# Patient Record
Sex: Female | Born: 1995 | Race: White | Hispanic: No | Marital: Married | Smoking: Former smoker
Health system: Southern US, Community
[De-identification: ages and names within clinical notes are randomized; demographics above are authoritative.]

## PROBLEM LIST (undated history)

## (undated) DIAGNOSIS — F32A Depression, unspecified: Secondary | ICD-10-CM

## (undated) DIAGNOSIS — F4310 Post-traumatic stress disorder, unspecified: Secondary | ICD-10-CM

## (undated) DIAGNOSIS — F419 Anxiety disorder, unspecified: Secondary | ICD-10-CM

## (undated) DIAGNOSIS — Z8041 Family history of malignant neoplasm of ovary: Secondary | ICD-10-CM

## (undated) DIAGNOSIS — O24419 Gestational diabetes mellitus in pregnancy, unspecified control: Secondary | ICD-10-CM

## (undated) DIAGNOSIS — F329 Major depressive disorder, single episode, unspecified: Secondary | ICD-10-CM

## (undated) HISTORY — DX: Family history of malignant neoplasm of ovary: Z80.41

## (undated) HISTORY — DX: Anxiety disorder, unspecified: F41.9

## (undated) HISTORY — PX: WISDOM TOOTH EXTRACTION: SHX21

## (undated) HISTORY — DX: Depression, unspecified: F32.A

## (undated) HISTORY — DX: Gestational diabetes mellitus in pregnancy, unspecified control: O24.419

## (undated) HISTORY — DX: Major depressive disorder, single episode, unspecified: F32.9

## (undated) HISTORY — DX: Post-traumatic stress disorder, unspecified: F43.10

---

## 2017-09-21 ENCOUNTER — Ambulatory Visit: Payer: Medicaid Other | Admitting: Family Medicine

## 2017-10-03 ENCOUNTER — Encounter: Payer: Self-pay | Admitting: Family Medicine

## 2017-10-03 ENCOUNTER — Ambulatory Visit (INDEPENDENT_AMBULATORY_CARE_PROVIDER_SITE_OTHER): Payer: Medicaid Other | Admitting: Family Medicine

## 2017-10-03 VITALS — BP 90/58 | HR 80 | Ht 61.0 in | Wt 115.0 lb

## 2017-10-03 DIAGNOSIS — Z7689 Persons encountering health services in other specified circumstances: Secondary | ICD-10-CM | POA: Diagnosis not present

## 2017-10-03 NOTE — Patient Instructions (Signed)
Acne  Acne is a skin problem that causes small, red bumps (pimples). Acne happens when the tiny holes in your skin (pores) get blocked. Your pores may become red, sore, and swollen. They may also become infected. Acne is a common skin problem. It is especially common in teenagers. Acne usually goes away over time.  Follow these instructions at home:  Good skin care is the most important thing you can do to treat your acne. Take care of your skin as told by your doctor. You may be told to do these things:  · Wash your skin gently at least two times each day. You should also wash your skin:  ? After you exercise.  ? Before you go to bed.  · Use mild soap.  · Use a water-based skin moisturizer after you wash your skin.  · Use a sunscreen or sunblock with SPF 30 or greater. This is very important if you are using acne medicines.  · Choose cosmetics that will not plug your oil glands (are noncomedogenic).    Medicines  · Take over-the-counter and prescription medicines only as told by your doctor.  · If you were prescribed an antibiotic medicine, apply or take it as told by your doctor. Do not stop using the antibiotic even if your acne improves.  General instructions  · Keep your hair clean and off of your face. Shampoo your hair regularly. If you have oily hair, you may need to wash it every day.  · Avoid leaning your chin or forehead on your hands.  · Avoid wearing tight headbands or hats.  · Avoid picking or squeezing your pimples. That can make your acne worse and cause scarring.  · Keep all follow-up visits as told by your doctor. This is important.  · Shave gently. Only shave when it is necessary.  · Keep a food journal. This can help you to see if any foods are linked with your acne.  Contact a doctor if:  · Your acne is not better after eight weeks.  · Your acne gets worse.  · You have a large area of skin that is red or tender.  · You think that you are having side effects from any acne medicine.  This  information is not intended to replace advice given to you by your health care provider. Make sure you discuss any questions you have with your health care provider.  Document Released: 02/03/2011 Document Revised: 07/23/2015 Document Reviewed: 04/23/2014  Elsevier Interactive Patient Education © 2018 [employer]

## 2017-10-03 NOTE — Progress Notes (Signed)
Name: Bethany Williams   MRN: [id number]    DOB: 1995/06/29   Date:10/03/2017       Progress Note  Subjective  Chief Complaint  Chief Complaint  Patient presents with  . Establish Care    Patient to establish primary care. Patient on Hydoxyzine 50 mg and celexa 40 mg.     No problem-specific Assessment & Plan notes found for this encounter.   Past Medical History:  Diagnosis Date  . Anxiety   . Depression     History reviewed. No pertinent surgical history.  Family History  Problem Relation Age of Onset  . Heart disease Mother   . Stroke Mother   . Hypertension Mother   . Diabetes Sister   . Hypertension Sister   . Cancer Maternal Grandmother   . Hypertension Maternal Grandmother   . Diabetes Maternal Grandfather   . Hypertension Maternal Grandfather   . Cancer Paternal Grandfather     Social History   Socioeconomic History  . Marital status: Single    Spouse name: Not on file  . Number of children: Not on file  . Years of education: Not on file  . Highest education level: Not on file  Occupational History  . Not on file  Social Needs  . Financial resource strain: Not on file  . Food insecurity:    Worry: Not on file    Inability: Not on file  . Transportation needs:    Medical: Not on file    Non-medical: Not on file  Tobacco Use  . Smoking status: Former Smoker    Types: Cigarettes    Last attempt to quit: 01/28/2017    Years since quitting: 0.6  . Smokeless tobacco: Never Used  Substance and Sexual Activity  . Alcohol use: Yes  . Drug use: Never  . Sexual activity: Yes  Lifestyle  . Physical activity:    Days per week: Not on file    Minutes per session: Not on file  . Stress: Not on file  Relationships  . Social connections:    Talks on phone: Not on file    Gets together: Not on file    Attends religious service: Not on file    Active member of club or organization: Not on file    Attends meetings of clubs or organizations: Not on file     Relationship status: Not on file  . Intimate partner violence:    Fear of current or ex partner: Not on file    Emotionally abused: Not on file    Physically abused: Not on file    Forced sexual activity: Not on file  Other Topics Concern  . Not on file  Social History Narrative  . Not on file    Allergies  Allergen Reactions  . Dilaudid [Hydromorphone]   . Latex   . Sulfa Antibiotics     Outpatient Medications Prior to Visit  Medication Sig Dispense Refill  . citalopram (CELEXA) 40 MG tablet Take 40 mg by mouth daily.    . hydrOXYzine (ATARAX/VISTARIL) 50 MG tablet Take 50 mg by mouth daily.     No facility-administered medications prior to visit.     Review of Systems  Constitutional: Negative for chills, fever, malaise/fatigue and weight loss.  HENT: Negative for ear discharge, ear pain and sore throat.   Eyes: Negative for blurred vision.  Respiratory: Negative for cough, sputum production, shortness of breath and wheezing.   Cardiovascular: Negative for chest pain, palpitations and leg  swelling.  Gastrointestinal: Negative for abdominal pain, blood in stool, constipation, diarrhea, heartburn, melena and nausea.  Genitourinary: Negative for dysuria, frequency, hematuria and urgency.  Musculoskeletal: Negative for back pain, joint pain, myalgias and neck pain.  Skin: Negative for rash.       Acneform rash  Neurological: Negative for dizziness, tingling, sensory change, focal weakness and headaches.  Endo/Heme/Allergies: Negative for environmental allergies and polydipsia. Does not bruise/bleed easily.  Psychiatric/Behavioral: Negative for depression and suicidal ideas. The patient is not nervous/anxious and does not have insomnia.      Objective  Vitals:   10/03/17 1449  BP: (!) 90/58  Pulse: 80  Weight: 115 lb (52.2 kg)  Height: 5\' 1"  (1.549 m)    Physical Exam  Constitutional: She is oriented to person, place, and time. She appears well-developed and  well-nourished.  HENT:  Head: Normocephalic.  Right Ear: External ear normal.  Left Ear: External ear normal.  Mouth/Throat: Oropharynx is clear and moist.  Eyes: Pupils are equal, round, and reactive to light. Conjunctivae and EOM are normal. Lids are everted and swept, no foreign bodies found. Left eye exhibits no hordeolum. No foreign body present in the left eye. Right conjunctiva is not injected. Left conjunctiva is not injected. No scleral icterus.  Neck: Normal range of motion. Neck supple. No JVD present. No tracheal deviation present. No thyromegaly present.  Cardiovascular: Normal rate, regular rhythm, normal heart sounds and intact distal pulses. Exam reveals no gallop and no friction rub.  No murmur heard. Pulmonary/Chest: Effort normal and breath sounds normal. No respiratory distress. She has no wheezes. She has no rales.  Abdominal: Soft. Bowel sounds are normal. She exhibits no mass. There is no hepatosplenomegaly. There is no tenderness. There is no rebound and no guarding.  Musculoskeletal: Normal range of motion. She exhibits no edema or tenderness.  Lymphadenopathy:    She has no cervical adenopathy.  Neurological: She is alert and oriented to person, place, and time. She has normal strength. She displays normal reflexes. No cranial nerve deficit.  Skin: Skin is warm. No rash noted.  Psychiatric: She has a normal mood and affect. Her mood appears not anxious. She does not exhibit a depressed mood.  Nursing note and vitals reviewed.     Assessment & Plan  Problem List Items Addressed This Visit    None    Visit Diagnoses    Establishing care with new doctor, encounter for    -  Primary   past history reviewed/ did not need refills on depression meds- stable on meds      No orders of the defined types were placed in this encounter.     Dr. Hayden Rasmusseneanna Quantez [facility]  10/03/17

## 2017-10-18 ENCOUNTER — Other Ambulatory Visit: Payer: Self-pay

## 2017-10-18 ENCOUNTER — Encounter: Payer: Self-pay | Admitting: Emergency Medicine

## 2017-10-18 ENCOUNTER — Emergency Department
Admission: EM | Admit: 2017-10-18 | Discharge: 2017-10-18 | Disposition: A | Discharge status: Home / Self Care | Payer: Medicaid Other | Attending: Emergency Medicine | Admitting: Emergency Medicine

## 2017-10-18 DIAGNOSIS — Z79899 Other long term (current) drug therapy: Secondary | ICD-10-CM | POA: Insufficient documentation

## 2017-10-18 DIAGNOSIS — Z87891 Personal history of nicotine dependence: Secondary | ICD-10-CM | POA: Diagnosis not present

## 2017-10-18 DIAGNOSIS — Z91040 Latex allergy status: Secondary | ICD-10-CM | POA: Diagnosis not present

## 2017-10-18 DIAGNOSIS — G43909 Migraine, unspecified, not intractable, without status migrainosus: Secondary | ICD-10-CM

## 2017-10-18 DIAGNOSIS — R112 Nausea with vomiting, unspecified: Secondary | ICD-10-CM | POA: Diagnosis not present

## 2017-10-18 LAB — URINALYSIS, COMPLETE (UACMP) WITH MICROSCOPIC
Bacteria, UA: NONE SEEN
Bilirubin Urine: NEGATIVE
GLUCOSE, UA: NEGATIVE mg/dL
Ketones, ur: 20 mg/dL — AB
NITRITE: NEGATIVE
PH: 6 (ref 5.0–8.0)
Protein, ur: NEGATIVE mg/dL
SPECIFIC GRAVITY, URINE: 1.021 (ref 1.005–1.030)

## 2017-10-18 LAB — COMPREHENSIVE METABOLIC PANEL
ALBUMIN: 4.6 g/dL (ref 3.5–5.0)
ALK PHOS: 52 U/L (ref 38–126)
ALT: 8 U/L (ref 0–44)
ANION GAP: 6 (ref 5–15)
AST: 16 U/L (ref 15–41)
BILIRUBIN TOTAL: 0.3 mg/dL (ref 0.3–1.2)
BUN: 20 mg/dL (ref 6–20)
CALCIUM: 9.2 mg/dL (ref 8.9–10.3)
CO2: 27 mmol/L (ref 22–32)
CREATININE: 0.82 mg/dL (ref 0.44–1.00)
Chloride: 107 mmol/L (ref 98–111)
GFR calc Af Amer: >60 mL/min (ref >60)
GFR calc non Af Amer: >60 mL/min (ref >60)
GLUCOSE: 87 mg/dL (ref 70–99)
Potassium: 3.8 mmol/L (ref 3.5–5.1)
Sodium: 140 mmol/L (ref 135–145)
TOTAL PROTEIN: 7.6 g/dL (ref 6.5–8.1)

## 2017-10-18 LAB — CBC
HCT: 35.9 % (ref 35.0–47.0)
Hemoglobin: 12.6 g/dL (ref 12.0–16.0)
MCH: 31.2 pg (ref 26.0–34.0)
MCHC: 35.1 g/dL (ref 32.0–36.0)
MCV: 88.7 fL (ref 80.0–100.0)
PLATELETS: 285 10*3/uL (ref 150–440)
RBC: 4.05 MIL/uL (ref 3.80–5.20)
RDW: 12.6 % (ref 11.5–14.5)
WBC: 8.7 10*3/uL (ref 3.6–11.0)

## 2017-10-18 LAB — LIPASE, BLOOD: Lipase: 28 U/L (ref 11–51)

## 2017-10-18 LAB — POCT PREGNANCY, URINE: Preg Test, Ur: NEGATIVE

## 2017-10-18 MED ORDER — BUTALBITAL-APAP-CAFFEINE 50-325-40 MG PO TABS: 1.0000 | ORAL_TABLET | Freq: Four times a day (QID) | ORAL | 0 refills | Status: DC | PRN

## 2017-10-18 MED ORDER — KETOROLAC TROMETHAMINE 30 MG/ML IJ SOLN
30.0000 mg | Freq: Once | INTRAMUSCULAR | Status: AC
  Administered 2017-10-18: 30 mg via INTRAVENOUS
  Filled 2017-10-18: qty 1

## 2017-10-18 MED ORDER — DIPHENHYDRAMINE HCL 50 MG/ML IJ SOLN
25.0000 mg | Freq: Once | INTRAMUSCULAR | Status: AC
  Administered 2017-10-18: 25 mg via INTRAVENOUS
  Filled 2017-10-18: qty 1

## 2017-10-18 MED ORDER — METOCLOPRAMIDE HCL 5 MG/ML IJ SOLN
10.0000 mg | Freq: Once | INTRAMUSCULAR | Status: AC
  Administered 2017-10-18: 10 mg via INTRAVENOUS
  Filled 2017-10-18: qty 2

## 2017-10-18 MED ORDER — SODIUM CHLORIDE 0.9 % IV SOLN
Freq: Once | INTRAVENOUS | Status: AC
  Administered 2017-10-18: 999 mL/h via INTRAVENOUS

## 2017-10-18 NOTE — Discharge Instructions (Addendum)
Please follow up with your primary care physician.

## 2017-10-18 NOTE — ED Notes (Signed)
ED Provider at bedside. 

## 2017-10-18 NOTE — ED Triage Notes (Signed)
Patient ambulatory to triage with steady gait, without difficulty or distress noted; pt reports migraine behind eyes accomp by nausea last few days unrelieved by tylenol

## 2017-10-18 NOTE — ED Provider Notes (Signed)
[facility] Emergency Department Provider Note   ____________________________________________   First MD Initiated Contact with Patient 10/18/17 0107     (approximate)  I have reviewed the triage vital signs and the nursing notes.   HISTORY  Chief Complaint Headache    HPI Bethany Williams is a 22 y.o. female who comes into the hospital today with a migraine.  The patient states that she had a for the last couple of days.  She reports that now she cannot keep anything down and she has not urinated in multiple hours.  The patient states that she does have a history of migraines and normally she would just take ibuprofen Tylenol Aleve but nothing has been working.  She rates her pain a 7 8 out of 10 in intensity.  This does feel like a typical migraine is just lasted longer.  She is sensitive to light but has no blurred vision.  She is here today for evaluation.  Past Medical History:  Diagnosis Date  . Anxiety   . Depression     There are no active problems to display for this patient.   History reviewed. No pertinent surgical history.  Prior to Admission medications   Medication Sig Start Date End Date Taking? Authorizing Provider  butalbital-acetaminophen-caffeine (FIORICET, ESGIC) [id number] MG tablet Take 1-2 tablets by mouth every 6 (six) hours as needed for headache. 10/18/17 10/18/18  Rebecka ApleyWebster, Enyah Moman P, MD  citalopram (CELEXA) 40 MG tablet Take 40 mg by mouth daily.    [provider]  hydrOXYzine (ATARAX/VISTARIL) 50 MG tablet Take 50 mg by mouth daily.    [provider]    Allergies Dilaudid [hydromorphone]; Latex; and Sulfa antibiotics  Family History  Problem Relation Age of Onset  . Heart disease Mother   . Stroke Mother   . Hypertension Mother   . Diabetes Sister   . Hypertension Sister   . Cancer Maternal Grandmother   . Hypertension Maternal Grandmother   . Diabetes Maternal Grandfather   . Hypertension Maternal  Grandfather   . Cancer Paternal Grandfather     Social History Social History   Tobacco Use  . Smoking status: Former Smoker    Types: Cigarettes    Last attempt to quit: 01/28/2017    Years since quitting: 0.7  . Smokeless tobacco: Never Used  Substance Use Topics  . Alcohol use: Yes  . Drug use: Never    Review of Systems  Constitutional: No fever/chills Eyes: No visual changes. ENT: No sore throat. Cardiovascular: Denies chest pain. Respiratory: Denies shortness of breath. Gastrointestinal: Nausea and vomiting with no abdominal pain.  No diarrhea.  No constipation. Genitourinary: Negative for dysuria. Musculoskeletal: Negative for back pain. Skin: Negative for rash. Neurological: Headache   ____________________________________________   PHYSICAL EXAM:  VITAL SIGNS: ED Triage Vitals  Enc Vitals Group     BP 10/18/17 0026 107/63     Pulse Rate 10/18/17 0026 70     Resp 10/18/17 0026 18     Temp 10/18/17 0026 98 F (36.7 C)     Temp Source 10/18/17 0026 Oral     SpO2 10/18/17 0026 98 %     Weight 10/18/17 0025 115 lb (52.2 kg)     Height 10/18/17 0025 5' (1.524 m)     Head Circumference --      Peak Flow --      Pain Score 10/18/17 0024 9     Pain Loc --  Pain Edu? --      Excl. in GC? --     Constitutional: Alert and oriented. Well appearing and in moderate distress. Eyes: Conjunctivae are normal. PERRL. EOMI. Head: Atraumatic. Nose: No congestion/rhinnorhea. Mouth/Throat: Mucous membranes are moist.  Oropharynx non-erythematous. Cardiovascular: Normal rate, regular rhythm. Grossly normal heart sounds.  Good peripheral circulation. Respiratory: Normal respiratory effort.  No retractions. Lungs CTAB. Gastrointestinal: Soft and nontender. No distention.  Positive bowel sounds Musculoskeletal: No lower extremity tenderness nor edema.   Neurologic:  Normal speech and language.  Skin:  Skin is warm, dry and intact.  Psychiatric: Mood and affect are  normal.   ____________________________________________   LABS (all labs ordered are listed, but only abnormal results are displayed)  Labs Reviewed  URINALYSIS, COMPLETE (UACMP) WITH MICROSCOPIC - Abnormal; Notable for the following components:      Result Value   Color, Urine YELLOW (*)    APPearance HAZY (*)    Hgb urine dipstick SMALL (*)    Ketones, ur 20 (*)    Leukocytes, UA MODERATE (*)    All other components within normal limits  LIPASE, BLOOD  COMPREHENSIVE METABOLIC PANEL  CBC  POC URINE PREG, ED  POCT PREGNANCY, URINE   ____________________________________________  EKG  none ____________________________________________  RADIOLOGY  ED MD interpretation:  none  Official radiology report(s): No results found.  ____________________________________________   PROCEDURES  Procedure(s) performed: None  Procedures  Critical Care performed: No  ____________________________________________   INITIAL IMPRESSION / ASSESSMENT AND PLAN / ED COURSE  As part of my medical decision making, I reviewed the following data within the electronic MEDICAL RECORD NUMBER Notes from prior ED visits and [place] Controlled Substance Database   This is a 22 year old female who comes into the hospital today with a migraine headache.  Patient has a history of migraines.  I did give her a dose of Reglan, Benadryl and Toradol.  I also give the patient a liter of normal saline.  After receiving the medication the patient states that her headache is improved.  She will be discharged home to follow-up with her primary care physician.      ____________________________________________   FINAL CLINICAL IMPRESSION(S) / ED DIAGNOSES  Final diagnoses:  Migraine without status migrainosus, not intractable, unspecified migraine type     ED Discharge Orders         Ordered    butalbital-acetaminophen-caffeine (FIORICET, ESGIC) 50-325-40 MG tablet  Every 6 hours PRN     10/18/17 0224             Note:  This document was prepared using Dragon voice recognition software and may include unintentional dictation errors.    Rebecka ApleyWebster, Jazline Cumbee P, MD 10/18/17 [phone]

## 2017-10-31 ENCOUNTER — Ambulatory Visit
Admission: RE | Admit: 2017-10-31 | Discharge: 2017-10-31 | Disposition: A | Discharge status: Home / Self Care | Payer: Medicaid Other | Source: Ambulatory Visit | Attending: Family Medicine | Admitting: Family Medicine

## 2017-10-31 ENCOUNTER — Encounter: Payer: Self-pay | Admitting: Family Medicine

## 2017-10-31 ENCOUNTER — Encounter (INDEPENDENT_AMBULATORY_CARE_PROVIDER_SITE_OTHER): Payer: Self-pay

## 2017-10-31 ENCOUNTER — Ambulatory Visit (INDEPENDENT_AMBULATORY_CARE_PROVIDER_SITE_OTHER): Payer: Medicaid Other | Admitting: Family Medicine

## 2017-10-31 VITALS — BP 120/64 | HR 84 | Ht 60.0 in | Wt 112.0 lb

## 2017-10-31 DIAGNOSIS — S5702XA Crushing injury of left elbow, initial encounter: Secondary | ICD-10-CM

## 2017-10-31 DIAGNOSIS — N3090 Cystitis, unspecified without hematuria: Secondary | ICD-10-CM | POA: Diagnosis not present

## 2017-10-31 DIAGNOSIS — S5782XA Crushing injury of left forearm, initial encounter: Secondary | ICD-10-CM | POA: Diagnosis not present

## 2017-10-31 DIAGNOSIS — M25522 Pain in left elbow: Secondary | ICD-10-CM | POA: Diagnosis not present

## 2017-10-31 LAB — POCT URINALYSIS DIPSTICK
Bilirubin, UA: NEGATIVE
Blood, UA: NEGATIVE
GLUCOSE UA: NEGATIVE
Ketones, UA: NEGATIVE
NITRITE UA: NEGATIVE
PH UA: 7 (ref 5.0–8.0)
Protein, UA: POSITIVE — AB
Spec Grav, UA: 1.02 (ref 1.010–1.025)
Urobilinogen, UA: 0.2 E.U./dL

## 2017-10-31 MED ORDER — NITROFURANTOIN MONOHYD MACRO 100 MG PO CAPS: 100.0000 mg | ORAL_CAPSULE | Freq: Two times a day (BID) | ORAL | 0 refills | Status: DC

## 2017-10-31 NOTE — Progress Notes (Signed)
Name: Bethany Williams   MRN: [id number]    DOB: [birth year]   Date:10/31/2017       Progress Note  Subjective  Chief Complaint  Chief Complaint  Patient presents with  . Urinary Tract Infection  . Elbow Pain    "slammed elbow into door"    Urinary Tract Infection   This is a new problem. The current episode started in the past 7 days. The problem has been gradually worsening. The quality of the pain is described as burning. The pain is mild. Pertinent negatives include no chills, discharge, flank pain, frequency, hematuria, hesitancy, nausea, sweats, urgency or vomiting. The treatment provided mild relief.  Arm Pain   The incident occurred 2 days ago. The incident occurred at home. The injury mechanism was a direct blow (smashed in sliding glass door). The pain is present in the left elbow. The quality of the pain is described as aching. The pain does not radiate. The pain is at a severity of 6/10. The pain is moderate. The pain has been constant since the incident. Pertinent negatives include no chest pain or tingling.    No problem-specific Assessment & Plan notes found for this encounter.   Past Medical History:  Diagnosis Date  . Anxiety   . Depression     History reviewed. No pertinent surgical history.  Family History  Problem Relation Age of Onset  . Heart disease Mother   . Stroke Mother   . Hypertension Mother   . Diabetes Sister   . Hypertension Sister   . Cancer Maternal Grandmother   . Hypertension Maternal Grandmother   . Diabetes Maternal Grandfather   . Hypertension Maternal Grandfather   . Cancer Paternal Grandfather     Social History   Socioeconomic History  . Marital status: Single    Spouse name: Not on file  . Number of children: Not on file  . Years of education: Not on file  . Highest education level: Not on file  Occupational History  . Not on file  Social Needs  . Financial resource strain: Not on file  . Food insecurity:    Worry: Not on  file    Inability: Not on file  . Transportation needs:    Medical: Not on file    Non-medical: Not on file  Tobacco Use  . Smoking status: Former Smoker    Types: Cigarettes    Last attempt to quit: 01/28/2017    Years since quitting: 0.7  . Smokeless tobacco: Never Used  Substance and Sexual Activity  . Alcohol use: Yes  . Drug use: Never  . Sexual activity: Yes  Lifestyle  . Physical activity:    Days per week: Not on file    Minutes per session: Not on file  . Stress: Not on file  Relationships  . Social connections:    Talks on phone: Not on file    Gets together: Not on file    Attends religious service: Not on file    Active member of club or organization: Not on file    Attends meetings of clubs or organizations: Not on file    Relationship status: Not on file  . Intimate partner violence:    Fear of current or ex partner: Not on file    Emotionally abused: Not on file    Physically abused: Not on file    Forced sexual activity: Not on file  Other Topics Concern  . Not on file  Social History Narrative  .  Not on file    Allergies  Allergen Reactions  . Dilaudid [Hydromorphone]   . Latex   . Sulfa Antibiotics     Outpatient Medications Prior to Visit  Medication Sig Dispense Refill  . butalbital-acetaminophen-caffeine (FIORICET, ESGIC) 50-325-40 MG tablet Take 1-2 tablets by mouth every 6 (six) hours as needed for headache. 20 tablet 0  . citalopram (CELEXA) 40 MG tablet Take 40 mg by mouth daily.    . hydrOXYzine (ATARAX/VISTARIL) 50 MG tablet Take 50 mg by mouth daily.     No facility-administered medications prior to visit.     Review of Systems  Constitutional: Negative for chills, fever, malaise/fatigue and weight loss.  HENT: Negative for ear discharge, ear pain and sore throat.   Eyes: Negative for blurred vision.  Respiratory: Negative for cough, sputum production, shortness of breath and wheezing.   Cardiovascular: Negative for chest pain,  palpitations and leg swelling.  Gastrointestinal: Negative for abdominal pain, blood in stool, constipation, diarrhea, heartburn, melena, nausea and vomiting.  Genitourinary: Negative for dysuria, flank pain, frequency, hematuria, hesitancy and urgency.  Musculoskeletal: Negative for back pain, joint pain, myalgias and neck pain.  Skin: Negative for rash.  Neurological: Negative for dizziness, tingling, sensory change, focal weakness and headaches.  Endo/Heme/Allergies: Negative for environmental allergies and polydipsia. Does not bruise/bleed easily.  Psychiatric/Behavioral: Negative for depression and suicidal ideas. The patient is not nervous/anxious and does not have insomnia.      Objective  Vitals:   10/31/17 1603  BP: 120/64  Pulse: 84  Weight: 112 lb (50.8 kg)  Height: 5' (1.524 m)    Physical Exam  Constitutional: She is oriented to person, place, and time. She appears well-developed and well-nourished.  HENT:  Head: Normocephalic.  Right Ear: External ear normal.  Left Ear: External ear normal.  Mouth/Throat: Oropharynx is clear and moist.  Eyes: Pupils are equal, round, and reactive to light. Conjunctivae and EOM are normal. Lids are everted and swept, no foreign bodies found. Left eye exhibits no hordeolum. No foreign body present in the left eye. Right conjunctiva is not injected. Left conjunctiva is not injected. No scleral icterus.  Neck: Normal range of motion. Neck supple. No JVD present. No tracheal deviation present. No thyromegaly present.  Cardiovascular: Normal rate, regular rhythm, normal heart sounds and intact distal pulses. Exam reveals no gallop and no friction rub.  No murmur heard. Pulmonary/Chest: Effort normal and breath sounds normal. No respiratory distress. She has no wheezes. She has no rales.  Abdominal: Soft. Bowel sounds are normal. She exhibits no mass. There is no hepatosplenomegaly. There is no tenderness. There is no rebound and no guarding.   Musculoskeletal: Normal range of motion. She exhibits no edema.       Left elbow: She exhibits swelling. Tenderness found. Radial head tenderness noted.  Lymphadenopathy:    She has no cervical adenopathy.  Neurological: She is alert and oriented to person, place, and time. She has normal strength. She displays normal reflexes. No cranial nerve deficit.  Skin: Skin is warm. No rash noted.  Psychiatric: She has a normal mood and affect. Her mood appears not anxious. She does not exhibit a depressed mood.  Nursing note and vitals reviewed.     Assessment & Plan  Problem List Items Addressed This Visit    None    Visit Diagnoses    Cystitis    -  Primary   Acute Will treat with macrobid bid for 3 days.   Relevant Orders  POCT Urinalysis Dipstick (Completed)   Crushing injury of left elbow, initial encounter       Acute persistent pain. Tenderness of radial head with limited rom. Complete xray elboe. Await stst reading.   Relevant Orders   DG Elbow Complete Left (Completed)      Meds ordered this encounter  Medications  . nitrofurantoin, macrocrystal-monohydrate, (MACROBID) 100 MG capsule    Sig: Take 1 capsule (100 mg total) by mouth 2 (two) times daily.    Dispense:  6 capsule    Refill:  0   Call from radiology Negative for fracture.   Dr. Hayden [facility]  10/31/17

## 2017-12-19 DIAGNOSIS — H5213 Myopia, bilateral: Secondary | ICD-10-CM | POA: Diagnosis not present

## 2017-12-22 DIAGNOSIS — H5213 Myopia, bilateral: Secondary | ICD-10-CM | POA: Diagnosis not present

## 2018-01-30 DIAGNOSIS — H52223 Regular astigmatism, bilateral: Secondary | ICD-10-CM | POA: Diagnosis not present

## 2018-02-12 ENCOUNTER — Ambulatory Visit: Payer: Medicaid Other | Admitting: Family Medicine

## 2018-02-19 ENCOUNTER — Ambulatory Visit (INDEPENDENT_AMBULATORY_CARE_PROVIDER_SITE_OTHER): Payer: Medicaid Other | Admitting: Family Medicine

## 2018-02-19 ENCOUNTER — Encounter: Payer: Self-pay | Admitting: Family Medicine

## 2018-02-19 VITALS — BP 97/74 | HR 94 | Temp 100.8°F | Resp 16 | Ht 60.0 in | Wt 111.0 lb

## 2018-02-19 DIAGNOSIS — J0100 Acute maxillary sinusitis, unspecified: Secondary | ICD-10-CM

## 2018-02-19 DIAGNOSIS — R5081 Fever presenting with conditions classified elsewhere: Secondary | ICD-10-CM | POA: Diagnosis not present

## 2018-02-19 LAB — POCT INFLUENZA A/B
Influenza A, POC: NEGATIVE
Influenza B, POC: POSITIVE — AB

## 2018-02-19 LAB — POCT RAPID STREP A (OFFICE): Rapid Strep A Screen: NEGATIVE

## 2018-02-19 MED ORDER — AZITHROMYCIN 250 MG PO TABS: ORAL_TABLET | ORAL | 0 refills | Status: DC

## 2018-02-19 NOTE — Progress Notes (Signed)
Date:  02/19/2018   Name:  Bethany Williams   DOB:  November 24, 1995   MRN:  [id number]   Chief Complaint: Sore Throat (headaches and backache as well x 24 hours )  Sore Throat   This is a new problem. The current episode started yesterday. The problem has been gradually worsening. The pain is worse on the right side. There has been no fever. The fever has been present for 1 to 2 days. The pain is mild. Associated symptoms include congestion and headaches. Pertinent negatives include no abdominal pain, coughing, diarrhea, drooling, ear discharge, ear pain, hoarse voice, plugged ear sensation, neck pain, shortness of breath, stridor, swollen glands, trouble swallowing or vomiting. She has had no exposure to strep or mono. She has tried nothing for the symptoms.    Review of Systems  Constitutional: Negative.  Negative for chills, fatigue, fever and unexpected weight change.  HENT: Positive for congestion. Negative for drooling, ear discharge, ear pain, hoarse voice, rhinorrhea, sinus pressure, sneezing, sore throat and trouble swallowing.   Eyes: Negative for photophobia, pain, discharge, redness and itching.  Respiratory: Negative for cough, shortness of breath, wheezing and stridor.   Gastrointestinal: Negative for abdominal pain, blood in stool, constipation, diarrhea, nausea and vomiting.  Endocrine: Negative for cold intolerance, heat intolerance, polydipsia, polyphagia and polyuria.  Genitourinary: Negative for dysuria, flank pain, frequency, hematuria, menstrual problem, pelvic pain, urgency, vaginal bleeding and vaginal discharge.  Musculoskeletal: Negative for arthralgias, back pain, myalgias and neck pain.  Skin: Negative for rash.  Allergic/Immunologic: Negative for environmental allergies and food allergies.  Neurological: Positive for headaches. Negative for dizziness, weakness, light-headedness and numbness.  Hematological: Negative for adenopathy. Does not bruise/bleed easily.    Psychiatric/Behavioral: Negative for dysphoric mood. The patient is not nervous/anxious.     There are no active problems to display for this patient.   Allergies  Allergen Reactions  . Dilaudid [Hydromorphone]   . Latex   . Sulfa Antibiotics     History reviewed. No pertinent surgical history.  Social History   Tobacco Use  . Smoking status: Former Smoker    Types: Cigarettes    Last attempt to quit: 01/28/2017    Years since quitting: 1.0  . Smokeless tobacco: Never Used  Substance Use Topics  . Alcohol use: Yes  . Drug use: Never     Medication list has been reviewed and updated.  Current Meds  Medication Sig  . hydrOXYzine (ATARAX/VISTARIL) 50 MG tablet Take 50 mg by mouth daily.  . [DISCONTINUED] butalbital-acetaminophen-caffeine (FIORICET, ESGIC) 50-325-40 MG tablet Take 1-2 tablets by mouth every 6 (six) hours as needed for headache.    PHQ 2/9 Scores 10/03/2017  PHQ - 2 Score 2  PHQ- 9 Score 4    Physical Exam Vitals signs and nursing note reviewed.  Constitutional:      General: She is not in acute distress.    Appearance: She is not diaphoretic.  HENT:     Head: Normocephalic and atraumatic.     Right Ear: Tympanic membrane and external ear normal.     Left Ear: Tympanic membrane and external ear normal.     Nose:     Right Sinus: Maxillary sinus tenderness present. No frontal sinus tenderness.     Left Sinus: No maxillary sinus tenderness or frontal sinus tenderness.  Eyes:     General:        Right eye: No discharge.        Left eye: No discharge.  Conjunctiva/sclera: Conjunctivae normal.     Pupils: Pupils are equal, round, and reactive to light.  Neck:     Musculoskeletal: Normal range of motion and neck supple.     Thyroid: No thyromegaly.     Vascular: No JVD.  Cardiovascular:     Rate and Rhythm: Normal rate and regular rhythm.     Heart sounds: Normal heart sounds. No murmur. No friction rub. No gallop.   Pulmonary:     Effort:  Pulmonary effort is normal.     Breath sounds: Normal breath sounds.  Abdominal:     General: Bowel sounds are normal.     Palpations: Abdomen is soft. There is no mass.     Tenderness: There is no abdominal tenderness. There is no guarding.  Musculoskeletal: Normal range of motion.  Lymphadenopathy:     Cervical: No cervical adenopathy.  Skin:    General: Skin is warm and dry.  Neurological:     Mental Status: She is alert.     Deep Tendon Reflexes: Reflexes are normal and symmetric.     BP 97/74   Pulse 94   Temp (!) 100.8 F (38.2 C) (Oral)   Resp 16   Ht 5' (1.524 m)   Wt 111 lb (50.3 kg)   LMP 02/12/2018   SpO2 98%   BMI 21.68 kg/m   Assessment and Plan: 1. Acute maxillary sinusitis, recurrence not specified Start ZPack use as directed - azithromycin (ZITHROMAX) 250 MG tablet; 2 today then 1 aday for 4 days  Dispense: 6 tablet; Refill: 0  2. Fever in other diseases BOTH A and B flu were negative Strep test was negative - POCT Influenza A/B - POCT rapid strep A

## 2018-08-09 ENCOUNTER — Encounter: Payer: Self-pay | Admitting: Family Medicine

## 2018-08-09 ENCOUNTER — Other Ambulatory Visit: Payer: Self-pay

## 2018-08-09 ENCOUNTER — Ambulatory Visit (INDEPENDENT_AMBULATORY_CARE_PROVIDER_SITE_OTHER): Payer: Medicaid Other | Admitting: Family Medicine

## 2018-08-09 VITALS — BP 120/70 | HR 80 | Ht 60.0 in | Wt 117.0 lb

## 2018-08-09 DIAGNOSIS — G8929 Other chronic pain: Secondary | ICD-10-CM

## 2018-08-09 DIAGNOSIS — Z3046 Encounter for surveillance of implantable subdermal contraceptive: Secondary | ICD-10-CM

## 2018-08-09 DIAGNOSIS — N3090 Cystitis, unspecified without hematuria: Secondary | ICD-10-CM

## 2018-08-09 DIAGNOSIS — R51 Headache: Secondary | ICD-10-CM | POA: Diagnosis not present

## 2018-08-09 LAB — POCT URINALYSIS DIPSTICK
Bilirubin, UA: NEGATIVE
Glucose, UA: NEGATIVE
Ketones, UA: NEGATIVE
Leukocytes, UA: NEGATIVE
Nitrite, UA: NEGATIVE
Protein, UA: POSITIVE — AB
Spec Grav, UA: 1.02 (ref 1.010–1.025)
Urobilinogen, UA: 0.2 E.U./dL
pH, UA: 8 (ref 5.0–8.0)

## 2018-08-09 MED ORDER — CEPHALEXIN 500 MG PO CAPS: 500.0000 mg | ORAL_CAPSULE | Freq: Two times a day (BID) | ORAL | 0 refills | Status: DC

## 2018-08-09 NOTE — Progress Notes (Signed)
Date:  08/09/2018   Name:  Bethany FellowsMary Williams   DOB:  1995-06-06   MRN:  [id number]   Chief Complaint: Migraine (butalbital is not helping- wants referral to neurology), Contraception (has implant that is due for replacement- needs referral to GYN), and Urinary Tract Infection (frequent urination with burning)  Migraine  This is a chronic problem. The current episode started more than 1 year ago (since 23 years old). The problem occurs intermittently. The problem has been gradually worsening (duration and intensity). The pain is located in the frontal region. The pain does not radiate. Quality: tight/squeeze. The pain is moderate. Associated symptoms include eye watering and photophobia. Pertinent negatives include no abdominal pain, abnormal behavior, anorexia, back pain, blurred vision, coughing, dizziness, drainage, ear pain, eye pain, eye redness, fever, hearing loss, nausea, neck pain, phonophobia, rhinorrhea, sinus pressure, sore throat, tingling, tinnitus, visual change or vomiting. She has tried acetaminophen (fioricet) for the symptoms. The treatment provided moderate relief.  Urinary Tract Infection  Pertinent negatives include no chills, frequency, hematuria, nausea, urgency or vomiting.    Review of Systems  Constitutional: Negative for chills and fever.  HENT: Negative for drooling, ear discharge, ear pain, hearing loss, rhinorrhea, sinus pressure, sore throat and tinnitus.   Eyes: Positive for photophobia. Negative for blurred vision, pain and redness.  Respiratory: Negative for cough, shortness of breath and wheezing.   Cardiovascular: Negative for chest pain, palpitations and leg swelling.  Gastrointestinal: Negative for abdominal pain, anorexia, blood in stool, constipation, diarrhea, nausea and vomiting.  Endocrine: Negative for polydipsia.  Genitourinary: Negative for dysuria, frequency, hematuria and urgency.  Musculoskeletal: Negative for back pain, myalgias and neck pain.   Skin: Negative for rash.  Allergic/Immunologic: Negative for environmental allergies.  Neurological: Negative for dizziness, tingling and headaches.  Hematological: Does not bruise/bleed easily.  Psychiatric/Behavioral: Negative for suicidal ideas. The patient is not nervous/anxious.     There are no active problems to display for this patient.   Allergies  Allergen Reactions  . Dilaudid [Hydromorphone]   . Latex   . Sulfa Antibiotics     No past surgical history on file.  Social History   Tobacco Use  . Smoking status: Current Every Day Smoker    Types: E-cigarettes    Last attempt to quit: 01/28/2017    Years since quitting: 1.5  . Smokeless tobacco: Never Used  Substance Use Topics  . Alcohol use: Yes  . Drug use: Never     Medication list has been reviewed and updated.  No outpatient medications have been marked as taking for the 08/09/18 encounter (Office Visit) with Duanne LimerickJones, Rodolfo Gaster C, MD.    [facility] 10/03/2017  PHQ - 2 Score 2  PHQ- 9 Score 4    BP Readings from Last 3 Encounters:  08/09/18 120/70  02/19/18 97/74  10/31/17 120/64    Physical Exam Vitals signs and nursing note reviewed.  Constitutional:      General: She is not in acute distress.    Appearance: She is not diaphoretic.  HENT:     Head: Normocephalic and atraumatic.     Right Ear: Tympanic membrane, ear canal and external ear normal.     Left Ear: Tympanic membrane, ear canal and external ear normal.     Nose: Nose normal.  Eyes:     General:        Right eye: No discharge.        Left eye: No discharge.  Conjunctiva/sclera: Conjunctivae normal.     Pupils: Pupils are equal, round, and reactive to light.  Neck:     Musculoskeletal: Normal range of motion and neck supple.     Thyroid: No thyromegaly.     Vascular: No JVD.  Cardiovascular:     Rate and Rhythm: Normal rate and regular rhythm.     Heart sounds: Normal heart sounds. No murmur. No friction rub. No gallop.    Pulmonary:     Effort: Pulmonary effort is normal.     Breath sounds: Normal breath sounds. No wheezing, rhonchi or rales.  Abdominal:     General: Bowel sounds are normal.     Palpations: Abdomen is soft. There is no mass.     Tenderness: There is no abdominal tenderness. There is no right CVA tenderness, left CVA tenderness, guarding or rebound.  Musculoskeletal: Normal range of motion.  Lymphadenopathy:     Cervical: No cervical adenopathy.  Skin:    General: Skin is warm and dry.  Neurological:     General: No focal deficit present.     Mental Status: She is alert.     Cranial Nerves: No cranial nerve deficit.     Sensory: No sensory deficit.     Motor: No weakness.     Deep Tendon Reflexes: Reflexes are normal and symmetric.     Wt Readings from Last 3 Encounters:  08/09/18 117 lb (53.1 kg)  02/19/18 111 lb (50.3 kg)  10/31/17 112 lb (50.8 kg)    BP 120/70   Pulse 80   Ht 5' (1.524 m)   Wt 117 lb (53.1 kg)   LMP 08/09/2018 (Exact Date)   BMI 22.85 kg/m   Assessment and Plan:   1. Cystitis Acute.  Point of care urinalysis is consistent with cute cystitis due to presence of leukocytes.  Will initiate cephalexin 500 mg twice a day for 10 days.  Patient - POCT urinalysis dipstick  2. Chronic nonintractable headache, unspecified headache type Patient has atypical migraine history and that she had a migraine that lasted for several weeks and is bilateral in the frontal area this may be more consistent with analgesic rebound headaches however we will refer to neurology for formal evaluation. - Ambulatory referral to Neurology  3. Encounter for surveillance of implantable subdermal contraceptive Patient is approaching the time it which her subdermal contraceptive needs to be replaced.  Patient noted that she did not have the level of acne until she started this I have told the patient that there are some birth control pills that be used to help with her acne but she is  may be reluctant to use this because she says she got pregnant on the pill when I pointed out that oftentimes is because pills have not been taken properly she declares that she did take them as prescribed and that she apparently got pregnant while taking oral contraceptives properly.  Will refer to gynecology for evaluation and possible treatment thereof - Ambulatory referral to Gynecology

## 2018-08-10 ENCOUNTER — Telehealth: Payer: Self-pay | Admitting: Obstetrics and Gynecology

## 2018-08-10 NOTE — Telephone Encounter (Signed)
Patient is schedule for Nexplanon replacement on 09/26/18 at the [facility] location with AMS

## 2018-08-13 NOTE — Telephone Encounter (Signed)
Noted. Will order to arrive by apt date/time. 

## 2018-08-14 ENCOUNTER — Encounter: Payer: Self-pay | Admitting: Family Medicine

## 2018-08-15 ENCOUNTER — Other Ambulatory Visit: Payer: Self-pay

## 2018-08-15 DIAGNOSIS — F329 Major depressive disorder, single episode, unspecified: Secondary | ICD-10-CM

## 2018-08-15 DIAGNOSIS — F32A Depression, unspecified: Secondary | ICD-10-CM

## 2018-08-15 NOTE — Progress Notes (Unsigned)
Ref psych for anxiety and depression

## 2018-08-17 DIAGNOSIS — R519 Headache, unspecified: Secondary | ICD-10-CM | POA: Insufficient documentation

## 2018-09-20 NOTE — Telephone Encounter (Signed)
Nexplanon reserved for this patient. 

## 2018-09-26 ENCOUNTER — Other Ambulatory Visit: Payer: Self-pay

## 2018-09-26 ENCOUNTER — Encounter: Payer: Self-pay | Admitting: Obstetrics and Gynecology

## 2018-09-26 ENCOUNTER — Other Ambulatory Visit (HOSPITAL_COMMUNITY)
Admission: RE | Admit: 2018-09-26 | Discharge: 2018-09-26 | Disposition: A | Discharge status: Home / Self Care | Payer: Medicaid Other | Source: Ambulatory Visit | Attending: Obstetrics and Gynecology | Admitting: Obstetrics and Gynecology

## 2018-09-26 ENCOUNTER — Ambulatory Visit (INDEPENDENT_AMBULATORY_CARE_PROVIDER_SITE_OTHER): Payer: Medicaid Other | Admitting: Obstetrics and Gynecology

## 2018-09-26 VITALS — BP 105/75 | HR 84 | Ht 60.0 in | Wt 116.0 lb

## 2018-09-26 DIAGNOSIS — Z30017 Encounter for initial prescription of implantable subdermal contraceptive: Secondary | ICD-10-CM

## 2018-09-26 DIAGNOSIS — Z113 Encounter for screening for infections with a predominantly sexual mode of transmission: Secondary | ICD-10-CM | POA: Insufficient documentation

## 2018-09-26 DIAGNOSIS — Z3049 Encounter for surveillance of other contraceptives: Secondary | ICD-10-CM | POA: Diagnosis not present

## 2018-09-26 DIAGNOSIS — Z124 Encounter for screening for malignant neoplasm of cervix: Secondary | ICD-10-CM | POA: Insufficient documentation

## 2018-09-26 DIAGNOSIS — Z3046 Encounter for surveillance of implantable subdermal contraceptive: Secondary | ICD-10-CM

## 2018-09-26 NOTE — Progress Notes (Signed)
   GYNECOLOGY PROCEDURE NOTE  Implanon removal discussed in detail.  Risks of infection, bleeding, nerve injury all reviewed.  Patient understands risks and desires to proceed.  Verbal consent obtained.  Patient is certain she wants the implanon removed and replaced.  All questions answered.written consent obtained.  She understands that Nexplanon is a progesterone only therapy, and that patients often patients have irregular and unpredictable vaginal bleeding or amenorrhea. She understands that other side effects are possible related to systemic progesterone, including but not limited to, headaches, breast tenderness, nausea, and irritability. While effective at preventing pregnancy long acting reversible contraceptives do not prevent transmission of sexually transmitted diseases and use of barrier methods for this purpose was discussed. The placement procedure for Nexplanon was reviewed with the patient in detail including risks of nerve injury, infection, bleeding and injury to other muscles or tendons. She understands that the Nexplanon implant is good for 3 years and needs to be removed at the end of that time.  She understands that Nexplanon is an extremely effective option for contraception, with failure rate of <1%. This information is reviewed today and all questions were answered. Informed consent was obtained, both verbally and written.    Procedure: Patient placed in dorsal supine with left arm above head, elbow flexed at 90 degrees, arm resting on examination table.  Implanon identified without problems.  Betadine scrub x3.  1 ml of 1% lidocaine injected under implanon device without problems.  Sterile gloves applied.  Small 0.5cm incision made at distal tip of implanon device with 11 blade scalpel.  Implanon brought to incision and grasped with a small kelly clamp.  Implanon removed intact without problems. Nexplanon removed form sterile blister packaging,  Device confirmed in needle, before  inserting full length of needle, tenting up the skin as the needle was advance.  The drug eluting rod was then deployed by pulling back the slider per the manufactures recommendation.  The implant was palpable by the clinician as well as the patient.  The insertion site covered dressed with a band aid before applying  a kerlex bandage pressure dressing..Minimal blood loss was noted during the procedure.  The patientt tolerated the procedure well.   She was instructed to wear the bandage for 24 hours, call with any signs of infection.  She was given the Implanon card and instructed to have the rod removed in 3 years.  Pap smear obtained as out of date.   Malachy Mood, MD, [facility], [place]

## 2018-09-27 NOTE — Telephone Encounter (Signed)
Rcvd/charged Nexplanon, Removal & Reinsert 09/26/2018

## 2018-10-01 LAB — CYTOLOGY - PAP
Adequacy: ABSENT — AB
Chlamydia: NEGATIVE
Neisseria Gonorrhea: NEGATIVE

## 2018-10-31 DIAGNOSIS — R51 Headache: Secondary | ICD-10-CM | POA: Diagnosis not present

## 2018-11-13 ENCOUNTER — Ambulatory Visit (INDEPENDENT_AMBULATORY_CARE_PROVIDER_SITE_OTHER): Payer: Medicaid Other | Admitting: Family Medicine

## 2018-11-13 ENCOUNTER — Other Ambulatory Visit: Payer: Self-pay

## 2018-11-13 ENCOUNTER — Encounter: Payer: Self-pay | Admitting: Family Medicine

## 2018-11-13 VITALS — BP 130/80 | HR 80 | Ht 60.0 in | Wt 106.0 lb

## 2018-11-13 DIAGNOSIS — N3090 Cystitis, unspecified without hematuria: Secondary | ICD-10-CM

## 2018-11-13 DIAGNOSIS — Z23 Encounter for immunization: Secondary | ICD-10-CM | POA: Diagnosis not present

## 2018-11-13 DIAGNOSIS — Z87440 Personal history of urinary (tract) infections: Secondary | ICD-10-CM | POA: Diagnosis not present

## 2018-11-13 LAB — POCT URINALYSIS DIPSTICK
Bilirubin, UA: NEGATIVE
Glucose, UA: NEGATIVE
Ketones, UA: NEGATIVE
Nitrite, UA: POSITIVE
Protein, UA: POSITIVE — AB
Spec Grav, UA: 1.015 (ref 1.010–1.025)
Urobilinogen, UA: 0.2 E.U./dL
pH, UA: 6 (ref 5.0–8.0)

## 2018-11-13 MED ORDER — NITROFURANTOIN MACROCRYSTAL 50 MG PO CAPS: 50.0000 mg | ORAL_CAPSULE | Freq: Four times a day (QID) | ORAL | 0 refills | Status: DC

## 2018-11-13 NOTE — Progress Notes (Signed)
Date:  11/13/2018   Name:  Bethany Williams   DOB:  01/31/1996   MRN:  [id number]   Chief Complaint: Urinary Tract Infection (burning when urinate, frequent urination and pressure) and influenza vacc need  Urinary Tract Infection  This is a recurrent problem. The current episode started in the past 7 days. The problem has been unchanged. The quality of the pain is described as burning. The pain is mild. There has been no fever. She is sexually active. There is no history of pyelonephritis. Associated symptoms include flank pain, frequency, hesitancy and urgency. Pertinent negatives include no chills, discharge, hematuria, nausea, possible pregnancy, sweats or vomiting. She has tried nothing for the symptoms. The treatment provided moderate relief. Her past medical history is significant for recurrent UTIs.    Review of Systems  Constitutional: Negative.  Negative for chills, fatigue, fever and unexpected weight change.  HENT: Negative for congestion, ear discharge, ear pain, rhinorrhea, sinus pressure, sneezing and sore throat.   Eyes: Negative for photophobia, pain, discharge, redness and itching.  Respiratory: Negative for cough, shortness of breath, wheezing and stridor.   Gastrointestinal: Negative for abdominal pain, blood in stool, constipation, diarrhea, nausea and vomiting.  Endocrine: Negative for cold intolerance, heat intolerance, polydipsia, polyphagia and polyuria.  Genitourinary: Positive for flank pain, frequency, hesitancy and urgency. Negative for dysuria, hematuria, menstrual problem, pelvic pain, vaginal bleeding and vaginal discharge.  Musculoskeletal: Negative for arthralgias, back pain and myalgias.  Skin: Negative for rash.  Allergic/Immunologic: Negative for environmental allergies and food allergies.  Neurological: Negative for dizziness, weakness, light-headedness, numbness and headaches.  Hematological: Negative for adenopathy. Does not bruise/bleed easily.   Psychiatric/Behavioral: Negative for dysphoric mood. The patient is not nervous/anxious.     There are no active problems to display for this patient.   Allergies  Allergen Reactions  . Dilaudid [Hydromorphone]   . Latex   . Sulfa Antibiotics     No past surgical history on file.  Social History   Tobacco Use  . Smoking status: Current Every Day Smoker    Types: E-cigarettes    Last attempt to quit: 01/28/2017    Years since quitting: 1.7  . Smokeless tobacco: Never Used  Substance Use Topics  . Alcohol use: Yes  . Drug use: Never     Medication list has been reviewed and updated.  Current Meds  Medication Sig  . nortriptyline (PAMELOR) 10 MG capsule TAKE 1 CAPSULE NIGHTLY FOR 1 WEEK THEN TAKE 2 CAPSULES BY MOUTH NIGHTLY  . rizatriptan (MAXALT-MLT) 10 MG disintegrating tablet DISSOLVE 1 TABLET IN MOUTH ONCE AS NEEDED FOR MIGRAINE FOR UP TO 1 DOSE. MAY TAKE A SECOND DOSE AFTER 2 HOURS IF NEEDED.  Marland Kitchen. sertraline (ZOLOFT) 50 MG tablet CBC  . traZODone (DESYREL) 50 MG tablet CBC    PHQ 2/9 Scores 11/13/2018 10/03/2017  PHQ - 2 Score 0 2  PHQ- 9 Score 0 4    BP Readings from Last 3 Encounters:  11/13/18 130/80  09/26/18 105/75  08/09/18 120/70    Physical Exam Vitals signs and nursing note reviewed.  Constitutional:      Appearance: She is well-developed.  HENT:     Head: Normocephalic.     Right Ear: Tympanic membrane and ear canal normal. There is no impacted cerumen.     Left Ear: Tympanic membrane, ear canal and external ear normal. There is no impacted cerumen.     Nose: Nose normal. No congestion or rhinorrhea.  Mouth/Throat:     Mouth: Mucous membranes are moist.  Eyes:     General: Lids are everted, no foreign bodies appreciated. No scleral icterus.       Left eye: No foreign body or hordeolum.     Conjunctiva/sclera: Conjunctivae normal.     Right eye: Right conjunctiva is not injected.     Left eye: Left conjunctiva is not injected.     Pupils:  Pupils are equal, round, and reactive to light.  Neck:     Musculoskeletal: Normal range of motion and neck supple.     Thyroid: No thyromegaly.     Vascular: No JVD.     Trachea: No tracheal deviation.  Cardiovascular:     Rate and Rhythm: Normal rate and regular rhythm.     Heart sounds: Normal heart sounds. No murmur. No friction rub. No gallop.   Pulmonary:     Effort: Pulmonary effort is normal. No respiratory distress.     Breath sounds: Normal breath sounds. No wheezing or rales.  Abdominal:     General: Bowel sounds are normal.     Palpations: Abdomen is soft. There is no mass.     Tenderness: There is no abdominal tenderness. There is no guarding or rebound.  Musculoskeletal: Normal range of motion.        General: No tenderness.  Lymphadenopathy:     Cervical: No cervical adenopathy.  Skin:    General: Skin is warm.     Findings: No rash.  Neurological:     Mental Status: She is alert and oriented to person, place, and time.     Cranial Nerves: No cranial nerve deficit.     Deep Tendon Reflexes: Reflexes normal.  Psychiatric:        Mood and Affect: Mood is not anxious or depressed.     Wt Readings from Last 3 Encounters:  11/13/18 106 lb (48.1 kg)  09/26/18 116 lb (52.6 kg)  08/09/18 117 lb (53.1 kg)    BP 130/80   Pulse 80   Ht 5' (1.524 m)   Wt 106 lb (48.1 kg)   BMI 20.70 kg/m   Assessment and Plan:  1. Cystitis Acute.  Recurrent.  Urinalysis is consistent with a urinary tract infection.  Will initiate nitrofurantoin 50 mg 4 times a day for 5 days. - nitrofurantoin (MACRODANTIN) 50 MG capsule; Take 1 capsule (50 mg total) by mouth 4 (four) times daily.  Dispense: 10 capsule; Refill: 0  2. 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  This is the second episode doing 4 months that I have seen patient for this and because of the recurrent nature of this I would suggest that we have  urology to consult to see if there is a concern for the persistence of this. - nitrofurantoin (MACRODANTIN) 50 MG capsule; Take 1 capsule (50 mg total) by mouth 4 (four) times daily.  Dispense: 10 capsule; Refill: 0 - Ambulatory referral to Urology - POCT urinalysis dipstick  3. Influenza vaccine needed Discussed and administered. - Flu Vaccine QUAD 6+ mos PF IM (Fluarix Quad PF)

## 2018-12-11 ENCOUNTER — Other Ambulatory Visit: Payer: Self-pay

## 2018-12-11 DIAGNOSIS — N390 Urinary tract infection, site not specified: Secondary | ICD-10-CM

## 2018-12-14 ENCOUNTER — Ambulatory Visit: Payer: Medicaid Other | Admitting: Urology

## 2018-12-17 ENCOUNTER — Telehealth: Payer: Self-pay | Admitting: *Deleted

## 2018-12-17 ENCOUNTER — Encounter: Payer: Self-pay | Admitting: Urology

## 2018-12-17 ENCOUNTER — Other Ambulatory Visit: Payer: Self-pay

## 2018-12-17 ENCOUNTER — Ambulatory Visit (INDEPENDENT_AMBULATORY_CARE_PROVIDER_SITE_OTHER): Payer: Medicaid Other | Admitting: Urology

## 2018-12-17 ENCOUNTER — Other Ambulatory Visit
Admission: RE | Admit: 2018-12-17 | Discharge: 2018-12-17 | Disposition: A | Discharge status: Home / Self Care | Payer: Medicaid Other | Attending: Urology | Admitting: Urology

## 2018-12-17 VITALS — BP 136/73 | HR 90 | Ht 60.0 in

## 2018-12-17 DIAGNOSIS — K5909 Other constipation: Secondary | ICD-10-CM | POA: Diagnosis not present

## 2018-12-17 DIAGNOSIS — N390 Urinary tract infection, site not specified: Secondary | ICD-10-CM

## 2018-12-17 DIAGNOSIS — N3000 Acute cystitis without hematuria: Secondary | ICD-10-CM | POA: Diagnosis not present

## 2018-12-17 LAB — URINALYSIS, COMPLETE (UACMP) WITH MICROSCOPIC
Bilirubin Urine: NEGATIVE
Glucose, UA: NEGATIVE mg/dL
Ketones, ur: NEGATIVE mg/dL
Nitrite: NEGATIVE
Protein, ur: NEGATIVE mg/dL
Specific Gravity, Urine: 1.015 (ref 1.005–1.030)
WBC, UA: >50 WBC/hpf (ref 0–5)
pH: 6.5 (ref 5.0–8.0)

## 2018-12-17 LAB — BLADDER SCAN AMB NON-IMAGING

## 2018-12-17 NOTE — Progress Notes (Signed)
12/17/2018 2:12 PM   Bethany Williams August 09, 1995 [id number]  Referring provider: Juline Patch, MD [address] [place],   [address]  Chief Complaint  Patient presents with  . Urinary Tract Infection    New Patient    HPI: 23 year old female referred for further evaluation of urinary tract infections.  She reports that she is always had frequent urinary tract infections compared to her peers as a teenager and known young adulthood.  Over the past year, she recalls being treated on 3 different occasions for urinary tract infection most recently last month.  She was seen and evaluated in her primary care physician on 11/13/2018 which time urine dipstick showed 2+ protein and 2+ leukocytes of cloudy urine.  This was also nitrite positive.  No culture was sent.  She was also seen and evaluated on 07/2018 at which time her urine was  negative on dip (positive for protein), again no culture was sent.  She has no documented urine cultures.  She reports that when she has symptoms, she has significant increased urinary frequency and urgency.  She also discomfort in her lower abdomen like someone is "messing with your bellybutton" which is a sign that she thinks she might be getting an infection.  She denies any overt dysuria.  No fevers or history of pyelonephritis.  She has occasionally gotten bilateral paraspinous pain which she associates with infections.  She is not currently sexually active.  She has been sexually active in the past.  She is a 72-year-old daughter.  She has Nexplanon for family-planning.  She reports that she drinks primarily coffee, tea, and water.  She does wipe from front to back.  She does not douche.   She drinks cranberry juice.  She does not take probiotics.  She does have a history of chronic constipation that has been worse over the past year.  Not take anything for this.  She reports that over the past few days, she has had the same UTI  type symptoms.  She states she is getting 1 today.  Her urinalysis does show greater than 30 white blood cells today.    PMH: Past Medical History:  Diagnosis Date  . Anxiety   . Depression     Surgical History: No past surgical history on file.  Home Medications:  Allergies as of 12/17/2018      Reactions   Sulfa Antibiotics Anaphylaxis   Dilaudid [hydromorphone]    Latex    Ketorolac Rash      Medication List       Accurate as of December 17, 2018  2:12 PM. If you have any questions, ask your nurse or doctor.        STOP taking these medications   nitrofurantoin 50 MG capsule Commonly known as: Macrodantin Stopped by: Hollice Espy, MD     TAKE these medications   nortriptyline 10 MG capsule Commonly known as: PAMELOR TAKE 1 CAPSULE NIGHTLY FOR 1 WEEK THEN TAKE 2 CAPSULES BY MOUTH NIGHTLY   rizatriptan 10 MG disintegrating tablet Commonly known as: MAXALT-MLT DISSOLVE 1 TABLET IN MOUTH ONCE AS NEEDED FOR MIGRAINE FOR UP TO 1 DOSE. MAY TAKE A SECOND DOSE AFTER 2 HOURS IF NEEDED.   sertraline 50 MG tablet Commonly known as: ZOLOFT CBC   traZODone 50 MG tablet Commonly known as: DESYREL CBC       Allergies:  Allergies  Allergen Reactions  . Sulfa Antibiotics Anaphylaxis  . Dilaudid [Hydromorphone]   . Latex   .  Ketorolac Rash    Family History: Family History  Problem Relation Age of Onset  . Heart disease Mother   . Stroke Mother   . Hypertension Mother   . Diabetes Sister   . Hypertension Sister   . Cancer Maternal Grandmother   . Hypertension Maternal Grandmother   . Diabetes Maternal Grandfather   . Hypertension Maternal Grandfather   . Cancer Paternal Grandfather     Social History:  reports that she has been smoking e-cigarettes. She has never used smokeless tobacco. She reports current alcohol use. She reports that she does not use drugs.  ROS: UROLOGY Frequent Urination?: Yes Hard to postpone urination?: Yes Burning/pain  with urination?: Yes Get up at night to urinate?: No Leakage of urine?: No Urine stream starts and stops?: No Trouble starting stream?: No Do you have to strain to urinate?: No Blood in urine?: No Urinary tract infection?: No Sexually transmitted disease?: No Injury to kidneys or bladder?: No Painful intercourse?: No Weak stream?: No Currently pregnant?: No Vaginal bleeding?: No Last menstrual period?: n  Gastrointestinal Nausea?: No Vomiting?: No Indigestion/heartburn?: No Diarrhea?: No Constipation?: No  Constitutional Fever: No Night sweats?: No Weight loss?: No Fatigue?: No  Skin Skin rash/lesions?: No Itching?: No  Eyes Blurred vision?: No Double vision?: No  Ears/Nose/Throat Sore throat?: No Sinus problems?: No  Hematologic/Lymphatic Swollen glands?: No Easy bruising?: No  Cardiovascular Leg swelling?: No Chest pain?: No  Respiratory Cough?: No Shortness of breath?: No  Endocrine Excessive thirst?: No  Musculoskeletal Back pain?: No Joint pain?: No  Neurological Headaches?: No Dizziness?: No  Psychologic Depression?: No Anxiety?: No  Physical Exam: BP 136/73   Pulse 90   Ht 5' (1.524 m)   BMI 20.70 kg/m   Constitutional:  Alert and oriented, No acute distress. HEENT: [place] AT, moist mucus membranes.  Trachea midline, no masses. Cardiovascular: No clubbing, cyanosis, or edema. Respiratory: Normal respiratory effort, no increased work of breathing. GI: Abdomen is soft, nontender, nondistended, no abdominal masses GU: No CVA tendernesshy. Skin: No rashes, bruises or suspicious lesions. Neurologic: Grossly intact, no focal deficits, moving all 4 extremities. Psychiatric: Normal mood and affect.  Laboratory Data: Lab Results  Component Value Date   WBC 8.7 10/18/2017   HGB 12.6 10/18/2017   HCT 35.9 10/18/2017   MCV 88.7 10/18/2017   PLT 285 10/18/2017    Lab Results  Component Value Date   CREATININE 0.82 10/18/2017     Urinalysis Component     Latest Ref Rng & Units 12/17/2018  Color, Urine     YELLOW STRAW (A)  Appearance     CLEAR HAZY (A)  Specific Gravity, Urine     1.005 - 1.030 1.015  pH     5.0 - 8.0 6.5  Glucose, UA     NEGATIVE mg/dL NEGATIVE  Hgb urine dipstick     NEGATIVE SMALL (A)  Bilirubin Urine     NEGATIVE NEGATIVE  Ketones, ur     NEGATIVE mg/dL NEGATIVE  Protein     NEGATIVE mg/dL NEGATIVE  Nitrite     NEGATIVE NEGATIVE  Leukocytes,Ua     NEGATIVE MODERATE (A)  Squamous Epithelial / LPF     0 - 5 0-5  WBC, UA     0 - 5 WBC/hpf >50  RBC / HPF     0 - 5 RBC/hpf 0-5  Bacteria, UA     NONE SEEN FEW (A)    Pertinent Imaging: Results for orders placed or performed  in visit on 12/17/18  BLADDER SCAN AMB NON-IMAGING  Result Value Ref Range   Scan Result 12ml     Assessment & Plan:    1. Recurrent UTI It is unclear whether or not the patient truly has a history of recurrent urinary tract infections given the absence of documented formal urinalysis/urine cultures  Her urinalysis today is suspicious for infection and she has symptoms which she reports are consistent with her history.  We will go ahead and send off urine culture today and treat as needed based on culture and sensitivity data.  We discussed behavioral modification at length today.  In addition to addressing her chronic constipation, I recommended some commonsense interventions such as cranberry tablets and probiotics daily.  In addition to the above, I strongly recommend that she come to our clinic/call with UTI type symptoms for formal UA/urine culture.  If we start to see a trend of true urinary tract infections, greater than 3/year will pursue further work-up including possible cystoscopy/renal ultrasound.  She is agreeable with the above plan. - BLADDER SCAN AMB NON-IMAGING - Urine Culture; Future - Urine Culture  2. Acute cystitis without hematuria As above  3. Chronic constipation We  discussed bowel bladder syndrome, advised to address constipation as may be contributing factor   Vanna Scotland, MD  [facility] [address], [address] [place], [place] [address] [phone]

## 2018-12-17 NOTE — Patient Instructions (Signed)
We discussed the following:  1.  Please call our office to arrange for UA/urine culture drop of if and when you think you have signs or symptoms of urinary tract infection.  If you have more than 3-year, will pursue further evaluation including cystoscopy and renal ultrasound.  2.  Importance of daily regular bowel movement was discussed.  Consider stool softener.  3.  Consider cranberry tablets supplementation.  4.  Consider daily probiotic.  5.  Hygiene was discussed, avoiding douching. Etc.

## 2018-12-17 NOTE — Telephone Encounter (Addendum)
Left VM asked to return call with any questions  ----- Message from Hollice Espy, MD sent at 12/17/2018  2:07 PM EDT ----- Urine looks infected.  Lets wait for the culture to come back which will probably be on Wednesday before starting antibiotics.  If your symptoms worsen tomorrow, let us know.  Hollice Espy, MD

## 2018-12-18 LAB — URINE CULTURE: Culture: <10000 — AB

## 2018-12-19 ENCOUNTER — Encounter: Payer: Self-pay | Admitting: Urology

## 2018-12-20 ENCOUNTER — Telehealth: Payer: Self-pay | Admitting: Urology

## 2018-12-20 NOTE — Telephone Encounter (Signed)
Patient notified and placed on [facility] schedule for tomorrow for cath UA

## 2018-12-20 NOTE — Telephone Encounter (Signed)
Please let this patient know that interestingly her urine culture was negative again today.  This does not make a lot of sense with her ongoing symptoms as well as what her urinalysis showed.  I believe that she likely does have an infection.  I like to try to get her to come into [facility] tomorrow again this time for a catheterized urinalysis and also send for additional test including mycoplasma and Ureaplasma which are more atypical UTI bugs that do not grow on traditional culture plates.    Hollice Espy, MD

## 2018-12-20 NOTE — Telephone Encounter (Signed)
-----   Message from Hollice Espy, MD sent at 12/17/2018  2:07 PM EDT ----- Urine looks infected.  Lets wait for the culture to come back which will probably be on Wednesday before starting antibiotics.  If your symptoms worsen tomorrow, let us know.  Hollice Espy, MD

## 2018-12-21 ENCOUNTER — Other Ambulatory Visit
Admission: RE | Admit: 2018-12-21 | Discharge: 2018-12-21 | Disposition: A | Discharge status: Home / Self Care | Payer: Medicaid Other | Attending: Urology | Admitting: Urology

## 2018-12-21 ENCOUNTER — Other Ambulatory Visit: Payer: Self-pay

## 2018-12-21 ENCOUNTER — Ambulatory Visit (INDEPENDENT_AMBULATORY_CARE_PROVIDER_SITE_OTHER): Payer: Medicaid Other | Admitting: Urology

## 2018-12-21 DIAGNOSIS — N390 Urinary tract infection, site not specified: Secondary | ICD-10-CM

## 2018-12-21 LAB — URINALYSIS, COMPLETE (UACMP) WITH MICROSCOPIC
Bilirubin Urine: NEGATIVE
Glucose, UA: NEGATIVE mg/dL
Ketones, ur: NEGATIVE mg/dL
Leukocytes,Ua: NEGATIVE
Nitrite: NEGATIVE
Protein, ur: NEGATIVE mg/dL
Specific Gravity, Urine: 1.02 (ref 1.005–1.030)
pH: 7.5 (ref 5.0–8.0)

## 2018-12-21 NOTE — Progress Notes (Signed)
In and Out Catheterization  Patient is present today for a I & O catheterization due to UTI. Patient was cleaned and prepped in a sterile fashion with betadine and Lidocaine 2% jelly was instilled into the urethra.  A 14FR cath was inserted no complications were noted , 58ml of urine return was noted, urine was yellow in color. A clean urine sample was collected for UA and Culture. Bladder was drained  And catheter was removed with out difficulty.    Preformed by: Fonnie Jarvis, CMA  Follow up/ Additional notes: will call with results

## 2018-12-22 LAB — URINE CULTURE: Culture: NO GROWTH

## 2018-12-26 ENCOUNTER — Telehealth: Payer: Self-pay | Admitting: *Deleted

## 2018-12-26 MED ORDER — NITROFURANTOIN MONOHYD MACRO 100 MG PO CAPS: 100.0000 mg | ORAL_CAPSULE | Freq: Two times a day (BID) | ORAL | 0 refills | Status: DC

## 2018-12-26 NOTE — Telephone Encounter (Addendum)
Patient informed-Macrobid sent to pharmacy as requested. Appointment scheduled-verbalized understanding.   ----- Message from Hollice Espy, MD sent at 12/25/2018  2:58 PM EDT ----- Interestingly, your urine culture was negative again.  On the catheterized specimen, you would markedly fewer red blood cells and white blood cells.  I am honestly having hard time making heads or tails of this.  I am waiting for 1 additional culture for atypical bacteria called mycoplasma and Ureaplasma to see if perhaps this is what is causing infection.  There is also a condition called sterile pyuria which is an inflammatory condition of the bladder which is not infectious.  I like to see you in clinic if possible on Friday to discuss further.  We may consider scoping your bladder to take a look inside to make sure that there is no other bladder pathology.  Please schedule.  In planning on doing this procedure, the like to go and start you on Macrobid twice daily for 7 days as a precaution.  Hollice Espy, MD

## 2018-12-27 ENCOUNTER — Telehealth: Payer: Self-pay

## 2018-12-27 LAB — MISC LABCORP TEST (SEND OUT): Labcorp test code: 86884

## 2018-12-27 MED ORDER — DOXYCYCLINE HYCLATE 100 MG PO CAPS: 100.0000 mg | ORAL_CAPSULE | Freq: Two times a day (BID) | ORAL | 0 refills | Status: DC

## 2018-12-27 NOTE — Telephone Encounter (Signed)
Patient notified per Dr. Erlene Quan that her Mycoplasma culture did come back positive so we will treat with Doxy 100mg  BID 7days. Her symptoms should get better after this. Per Dr. Erlene Quan her cysto apt can be cancelled for tomorrow. Patient will follow up as needed

## 2018-12-28 ENCOUNTER — Other Ambulatory Visit: Payer: Self-pay | Admitting: Urology

## 2019-09-17 DIAGNOSIS — Z79899 Other long term (current) drug therapy: Secondary | ICD-10-CM | POA: Diagnosis not present

## 2019-09-17 DIAGNOSIS — Z1389 Encounter for screening for other disorder: Secondary | ICD-10-CM | POA: Diagnosis not present

## 2019-09-17 DIAGNOSIS — F4312 Post-traumatic stress disorder, chronic: Secondary | ICD-10-CM | POA: Diagnosis not present

## 2019-09-17 DIAGNOSIS — F39 Unspecified mood [affective] disorder: Secondary | ICD-10-CM | POA: Diagnosis not present

## 2019-10-08 DIAGNOSIS — F4312 Post-traumatic stress disorder, chronic: Secondary | ICD-10-CM | POA: Diagnosis not present

## 2019-10-08 DIAGNOSIS — Z1389 Encounter for screening for other disorder: Secondary | ICD-10-CM | POA: Diagnosis not present

## 2019-10-15 DIAGNOSIS — F4312 Post-traumatic stress disorder, chronic: Secondary | ICD-10-CM | POA: Diagnosis not present

## 2019-10-15 DIAGNOSIS — Z1389 Encounter for screening for other disorder: Secondary | ICD-10-CM | POA: Diagnosis not present

## 2019-10-22 DIAGNOSIS — F4312 Post-traumatic stress disorder, chronic: Secondary | ICD-10-CM | POA: Diagnosis not present

## 2019-10-28 DIAGNOSIS — Z882 Allergy status to sulfonamides status: Secondary | ICD-10-CM | POA: Diagnosis not present

## 2019-10-28 DIAGNOSIS — R61 Generalized hyperhidrosis: Secondary | ICD-10-CM | POA: Diagnosis not present

## 2019-10-28 DIAGNOSIS — G43909 Migraine, unspecified, not intractable, without status migrainosus: Secondary | ICD-10-CM | POA: Diagnosis not present

## 2019-10-28 DIAGNOSIS — H53149 Visual discomfort, unspecified: Secondary | ICD-10-CM | POA: Diagnosis not present

## 2019-10-28 DIAGNOSIS — Z87891 Personal history of nicotine dependence: Secondary | ICD-10-CM | POA: Diagnosis not present

## 2019-10-28 DIAGNOSIS — R112 Nausea with vomiting, unspecified: Secondary | ICD-10-CM | POA: Diagnosis not present

## 2019-10-28 DIAGNOSIS — G43009 Migraine without aura, not intractable, without status migrainosus: Secondary | ICD-10-CM | POA: Diagnosis not present

## 2019-10-29 DIAGNOSIS — Z1389 Encounter for screening for other disorder: Secondary | ICD-10-CM | POA: Diagnosis not present

## 2019-10-29 DIAGNOSIS — F4312 Post-traumatic stress disorder, chronic: Secondary | ICD-10-CM | POA: Diagnosis not present

## 2019-11-05 DIAGNOSIS — F4312 Post-traumatic stress disorder, chronic: Secondary | ICD-10-CM | POA: Diagnosis not present

## 2019-11-05 DIAGNOSIS — Z1389 Encounter for screening for other disorder: Secondary | ICD-10-CM | POA: Diagnosis not present

## 2019-11-12 DIAGNOSIS — F4312 Post-traumatic stress disorder, chronic: Secondary | ICD-10-CM | POA: Diagnosis not present

## 2019-11-12 DIAGNOSIS — Z1389 Encounter for screening for other disorder: Secondary | ICD-10-CM | POA: Diagnosis not present

## 2019-11-22 DIAGNOSIS — R930 Abnormal findings on diagnostic imaging of skull and head, not elsewhere classified: Secondary | ICD-10-CM | POA: Diagnosis not present

## 2019-11-22 DIAGNOSIS — Z87891 Personal history of nicotine dependence: Secondary | ICD-10-CM | POA: Diagnosis not present

## 2019-11-22 DIAGNOSIS — R112 Nausea with vomiting, unspecified: Secondary | ICD-10-CM | POA: Diagnosis not present

## 2019-11-22 DIAGNOSIS — R202 Paresthesia of skin: Secondary | ICD-10-CM | POA: Diagnosis not present

## 2019-11-22 DIAGNOSIS — S060X0A Concussion without loss of consciousness, initial encounter: Secondary | ICD-10-CM | POA: Diagnosis not present

## 2019-11-22 DIAGNOSIS — S0990XA Unspecified injury of head, initial encounter: Secondary | ICD-10-CM | POA: Diagnosis not present

## 2019-12-10 DIAGNOSIS — F4312 Post-traumatic stress disorder, chronic: Secondary | ICD-10-CM | POA: Diagnosis not present

## 2019-12-10 DIAGNOSIS — Z1389 Encounter for screening for other disorder: Secondary | ICD-10-CM | POA: Diagnosis not present

## 2019-12-17 DIAGNOSIS — Z1389 Encounter for screening for other disorder: Secondary | ICD-10-CM | POA: Diagnosis not present

## 2019-12-17 DIAGNOSIS — F4312 Post-traumatic stress disorder, chronic: Secondary | ICD-10-CM | POA: Diagnosis not present

## 2020-01-14 DIAGNOSIS — F4312 Post-traumatic stress disorder, chronic: Secondary | ICD-10-CM | POA: Diagnosis not present

## 2020-01-14 DIAGNOSIS — F39 Unspecified mood [affective] disorder: Secondary | ICD-10-CM | POA: Diagnosis not present

## 2020-02-11 DIAGNOSIS — F4312 Post-traumatic stress disorder, chronic: Secondary | ICD-10-CM | POA: Diagnosis not present

## 2020-02-25 IMAGING — CR DG ELBOW COMPLETE 3+V*L*
4 series · 4 of 4 positions shown · non-contrast
Comparison: None.

CLINICAL DATA: Crush injury of left elbow over the weekend.

EXAM:
LEFT ELBOW - COMPLETE 3+ VIEW

[elbow obl (1 of 2)]
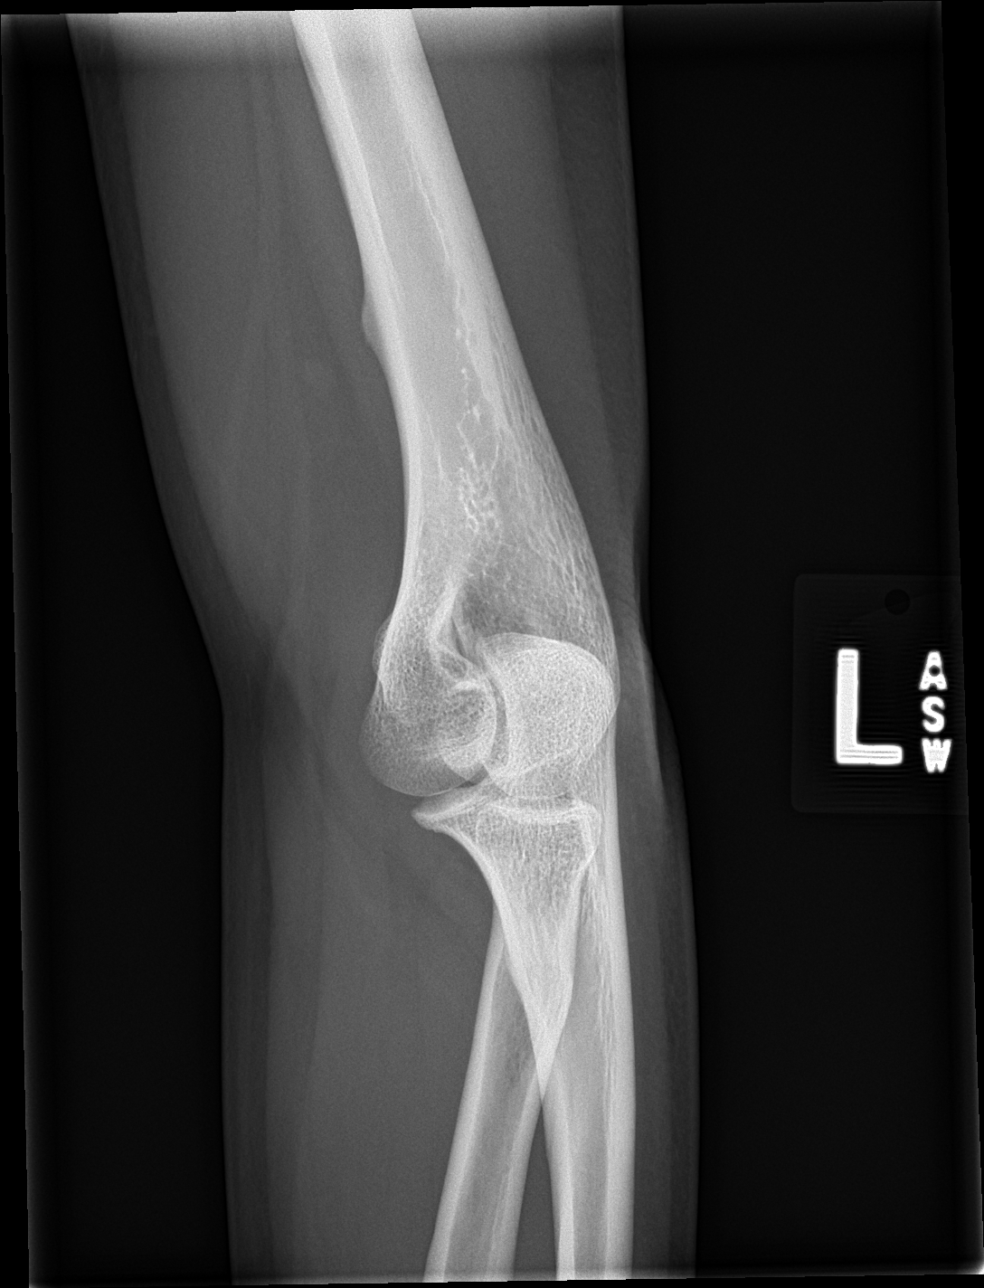

[elbow obl (2 of 2)]
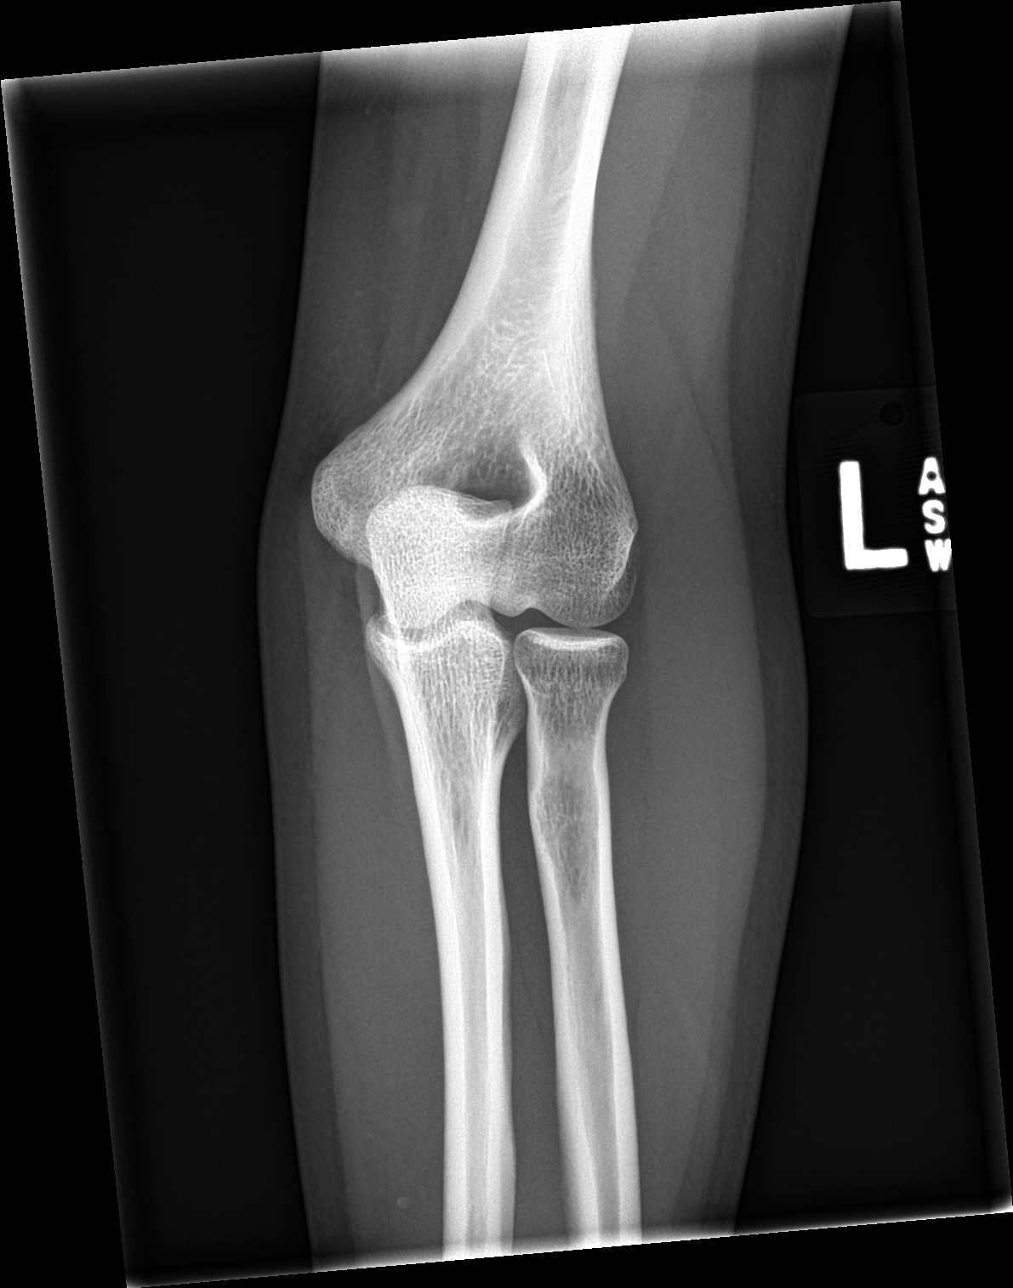

[elbow lat]
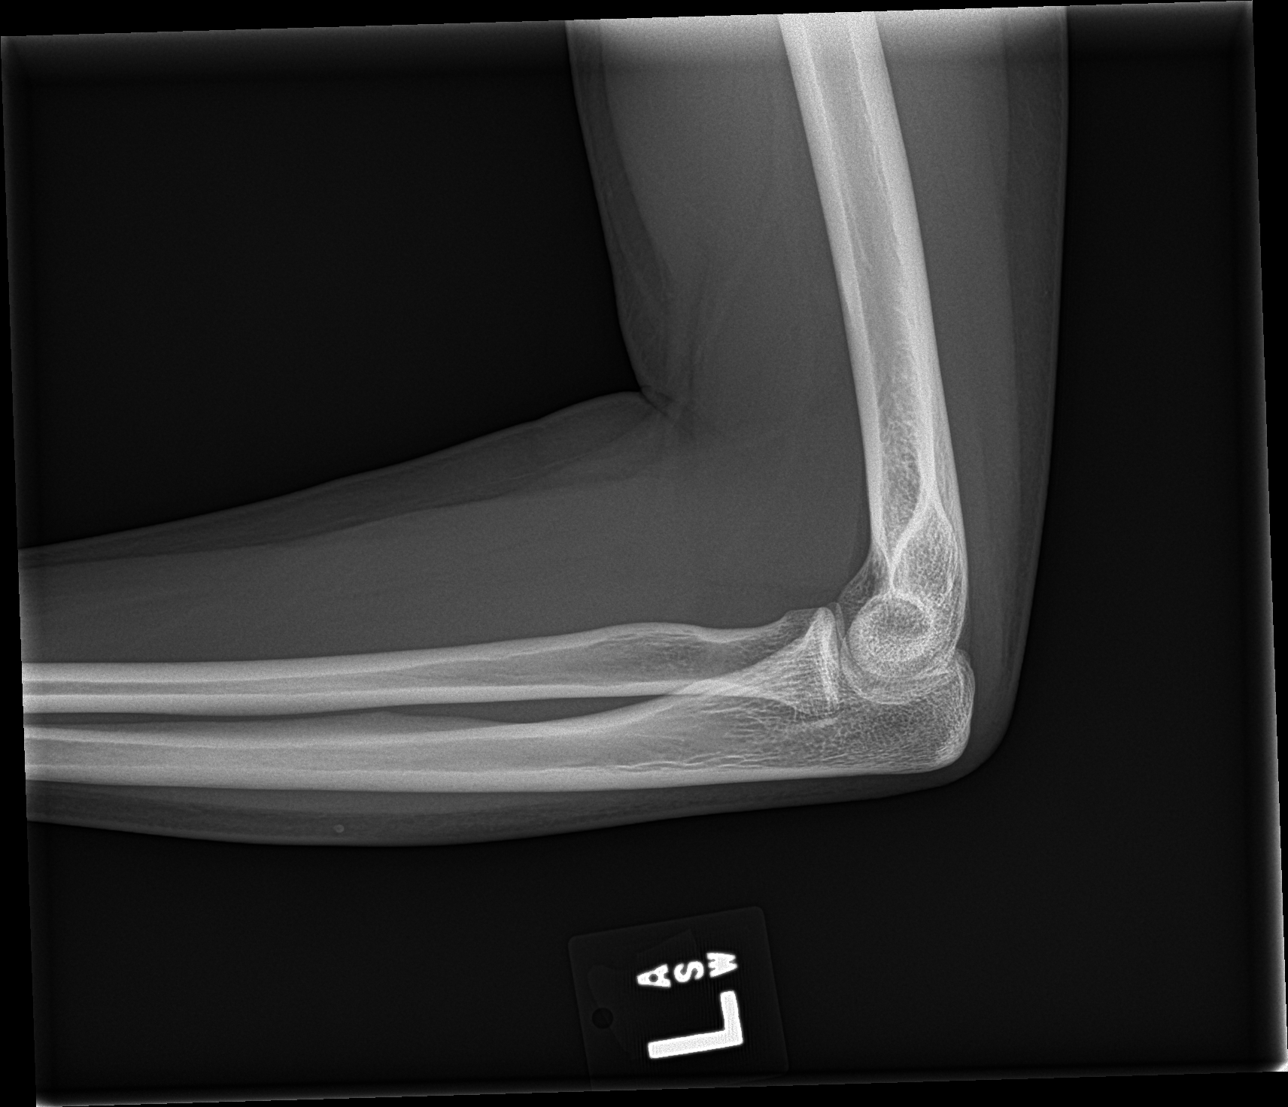

[elbow ap]
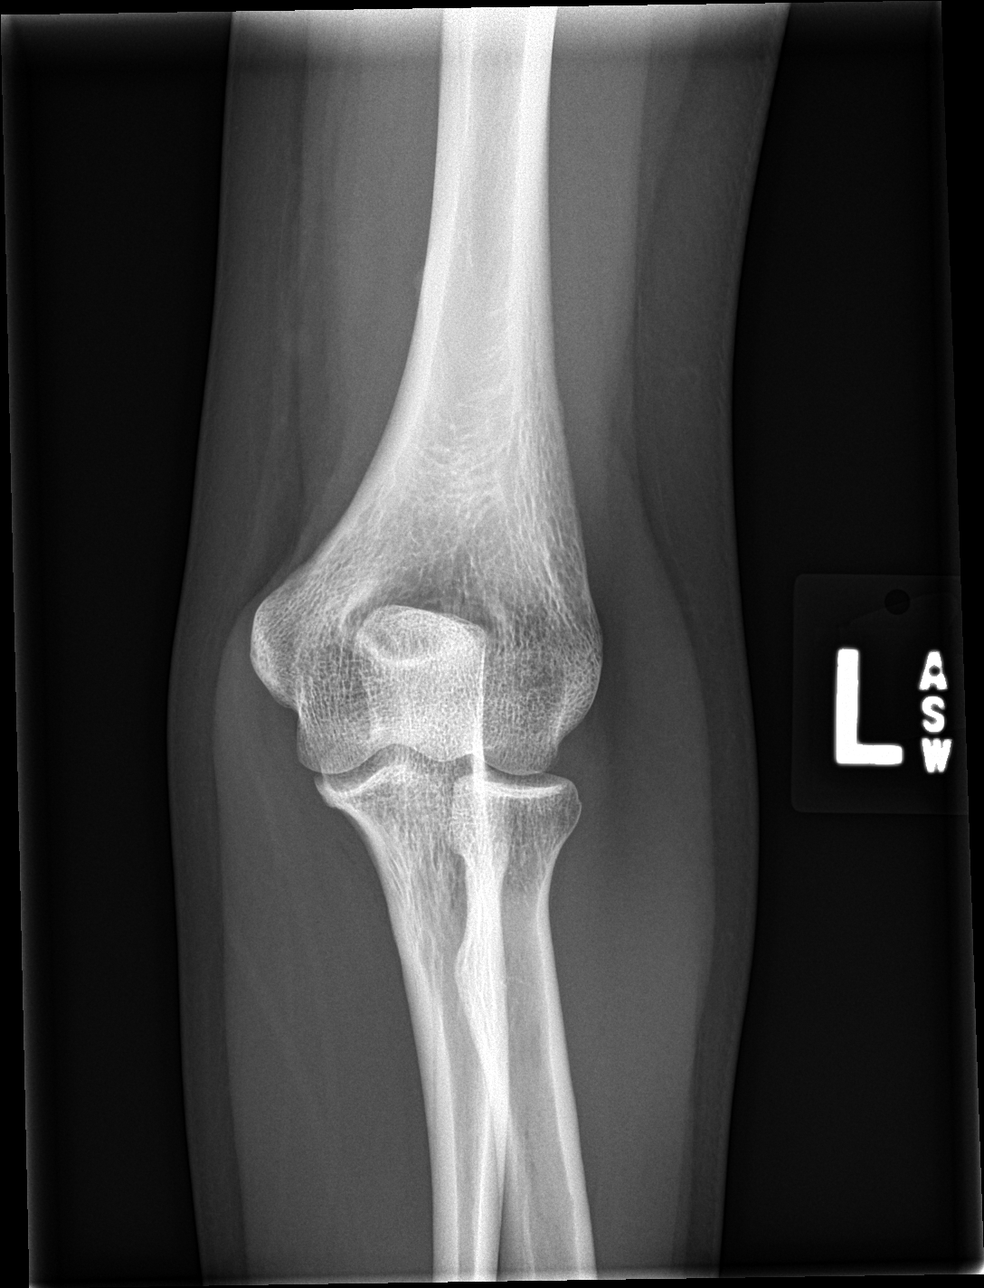

[4 of 4 positions shown; findings below may reference images not displayed]

FINDINGS: There is no evidence of fracture, dislocation, or joint effusion.
There is no evidence of arthropathy or other focal bone abnormality.
Soft tissues are unremarkable.
IMPRESSION: Negative.

## 2020-03-24 DIAGNOSIS — F4312 Post-traumatic stress disorder, chronic: Secondary | ICD-10-CM | POA: Diagnosis not present

## 2020-03-24 DIAGNOSIS — Z79899 Other long term (current) drug therapy: Secondary | ICD-10-CM | POA: Diagnosis not present

## 2020-03-24 DIAGNOSIS — Z1389 Encounter for screening for other disorder: Secondary | ICD-10-CM | POA: Diagnosis not present

## 2020-06-23 DIAGNOSIS — F4312 Post-traumatic stress disorder, chronic: Secondary | ICD-10-CM | POA: Diagnosis not present

## 2020-07-20 ENCOUNTER — Telehealth: Payer: Self-pay

## 2020-07-20 NOTE — Telephone Encounter (Signed)
Called pt and verified her last Pap was 2020- she will call Dr. Bonney Aid and get chlamydia checked

## 2020-08-24 DIAGNOSIS — F4312 Post-traumatic stress disorder, chronic: Secondary | ICD-10-CM | POA: Diagnosis not present

## 2020-08-25 ENCOUNTER — Encounter: Payer: Medicaid Other | Admitting: Family Medicine

## 2020-08-25 DIAGNOSIS — Z79899 Other long term (current) drug therapy: Secondary | ICD-10-CM | POA: Diagnosis not present

## 2020-08-25 DIAGNOSIS — R5383 Other fatigue: Secondary | ICD-10-CM | POA: Diagnosis not present

## 2020-09-21 DIAGNOSIS — F4312 Post-traumatic stress disorder, chronic: Secondary | ICD-10-CM | POA: Diagnosis not present

## 2020-09-21 DIAGNOSIS — Z1389 Encounter for screening for other disorder: Secondary | ICD-10-CM | POA: Diagnosis not present

## 2020-09-21 DIAGNOSIS — Z79899 Other long term (current) drug therapy: Secondary | ICD-10-CM | POA: Diagnosis not present

## 2020-10-26 DIAGNOSIS — F4312 Post-traumatic stress disorder, chronic: Secondary | ICD-10-CM | POA: Diagnosis not present

## 2020-12-21 DIAGNOSIS — F4312 Post-traumatic stress disorder, chronic: Secondary | ICD-10-CM | POA: Diagnosis not present

## 2021-01-15 ENCOUNTER — Ambulatory Visit: Payer: Medicaid Other | Admitting: Obstetrics

## 2021-01-26 ENCOUNTER — Ambulatory Visit: Payer: Medicaid Other | Admitting: Licensed Practical Nurse

## 2021-01-27 DIAGNOSIS — F4312 Post-traumatic stress disorder, chronic: Secondary | ICD-10-CM | POA: Diagnosis not present

## 2021-01-29 ENCOUNTER — Other Ambulatory Visit: Payer: Self-pay

## 2021-01-29 ENCOUNTER — Other Ambulatory Visit (HOSPITAL_COMMUNITY)
Admission: RE | Admit: 2021-01-29 | Discharge: 2021-01-29 | Disposition: A | Discharge status: Home / Self Care | Payer: Medicaid Other | Source: Ambulatory Visit | Attending: Obstetrics | Admitting: Obstetrics

## 2021-01-29 ENCOUNTER — Ambulatory Visit (INDEPENDENT_AMBULATORY_CARE_PROVIDER_SITE_OTHER): Payer: Medicaid Other | Admitting: Obstetrics

## 2021-01-29 ENCOUNTER — Encounter: Payer: Self-pay | Admitting: Obstetrics

## 2021-01-29 VITALS — BP 90/60 | Ht 60.0 in | Wt 111.0 lb

## 2021-01-29 DIAGNOSIS — Z8751 Personal history of pre-term labor: Secondary | ICD-10-CM | POA: Diagnosis not present

## 2021-01-29 DIAGNOSIS — Z113 Encounter for screening for infections with a predominantly sexual mode of transmission: Secondary | ICD-10-CM

## 2021-01-29 DIAGNOSIS — Z8742 Personal history of other diseases of the female genital tract: Secondary | ICD-10-CM | POA: Diagnosis not present

## 2021-01-29 DIAGNOSIS — Z124 Encounter for screening for malignant neoplasm of cervix: Secondary | ICD-10-CM | POA: Insufficient documentation

## 2021-01-29 DIAGNOSIS — Z01419 Encounter for gynecological examination (general) (routine) without abnormal findings: Secondary | ICD-10-CM | POA: Diagnosis not present

## 2021-01-29 LAB — HM PAP SMEAR: HM Pap smear: NORMAL

## 2021-01-29 NOTE — Progress Notes (Signed)
Gynecology Annual Exam  PCP: Duanne Limerick, MD  Chief Complaint:  Chief Complaint  Patient presents with   Gynecologic Exam    STD testing    History of Present Illness:  Ms. Bethany Williams is a 25 y.o. G1P0101 who LMP was Patient's last menstrual period was 01/04/2021 (exact date)., presents today for her annual examination.  Her menses are regular every 28 days, lasting 5 day(s).  Dysmenorrhea mild, occurring first 1-2 days of flow. She does not have intermenstrual bleeding.  She is single partner, contraception - nexplanon.  Last Pap: September 26, 2018  Results were: LGSIS: IN-1/HPV       she did not follow up last year. Hx of STDs: none  There is no FH of breast cancer. There is no FH of ovarian cancer. The patient does not do self-breast exams.  Tobacco use: She uses smokeless tobacco. Alcohol use:  did not ask Exercise: moderately active    The patient wears seatbelts: yes.   The patient reports that domestic violence in her life is absent.   Past Medical History:  Diagnosis Date   Anxiety    Depression     Past Surgical History:  Procedure Laterality Date   WISDOM TOOTH EXTRACTION      Prior to Admission medications   Medication Sig Start Date End Date Taking? Authorizing Provider  FLUoxetine (PROZAC) 10 MG capsule Take 20 mg by mouth every morning. 01/22/21  Yes [provider]  guanFACINE (INTUNIV) 1 MG TB24 ER tablet Take 1 mg by mouth at bedtime. 01/19/21  Yes [provider]  lamoTRIgine (LAMICTAL) 25 MG tablet Take 50 mg by mouth at bedtime. 01/18/21  Yes [provider]  traZODone (DESYREL) 50 MG tablet CBC 09/07/18  Yes [provider]    Allergies  Allergen Reactions   Sulfa Antibiotics Anaphylaxis   Dilaudid [Hydromorphone]    Latex    Ketorolac Rash    Gynecologic History: Patient's last menstrual period was 01/04/2021 (exact date). History of abnormal pap smear: Yes History of STI: No   Obstetric History:  G1P0101  Social History   Socioeconomic History   Marital status: Single    Spouse name: Not on file   Number of children: Not on file   Years of education: Not on file   Highest education level: Not on file  Occupational History   Not on file  Tobacco Use   Smoking status: Every Day    Types: E-cigarettes    Last attempt to quit: 01/28/2017    Years since quitting: 4.0   Smokeless tobacco: Never  Vaping Use   Vaping Use: Every day  Substance and Sexual Activity   Alcohol use: Yes   Drug use: Never   Sexual activity: Yes    Birth control/protection: Implant  Other Topics Concern   Not on file  Social History Narrative   Not on file   Social Determinants of Health   Financial Resource Strain: Not on file  Food Insecurity: Not on file  Transportation Needs: Not on file  Physical Activity: Not on file  Stress: Not on file  Social Connections: Not on file  Intimate Partner Violence: Not on file    Family History  Problem Relation Age of Onset   Ovarian cancer Mother 57   Heart disease Mother    Stroke Mother    Hypertension Mother    Leukemia Father    Diabetes Sister    Hypertension Sister    Hypertension Maternal  Grandmother    Diabetes Maternal Grandfather    Hypertension Maternal Grandfather    Lung cancer Paternal Grandfather     Review of Systems  Constitutional: Negative.   HENT: Negative.    Eyes: Negative.   Respiratory: Negative.    Cardiovascular: Negative.   Gastrointestinal: Negative.   Genitourinary: Negative.   Musculoskeletal: Negative.   Skin: Negative.   Neurological: Negative.   Endo/Heme/Allergies: Negative.   Psychiatric/Behavioral: Negative.      Physical Exam BP 90/60   Ht 5' (1.524 m)   Wt 111 lb (50.3 kg)   LMP 01/04/2021 (Exact Date)   BMI 21.68 kg/m    Physical Exam Constitutional:      Appearance: Normal appearance. She is normal weight.  Genitourinary:     Vulva and rectum normal.     Genitourinary Comments: No  external lesions Normal vaginal rugae Uterus is antverted, non enlarged. No adnexal tenderness.  HENT:     Head: Normocephalic and atraumatic.  Cardiovascular:     Rate and Rhythm: Normal rate and regular rhythm.     Pulses: Normal pulses.     Heart sounds: Normal heart sounds.  Pulmonary:     Effort: Pulmonary effort is normal.     Breath sounds: Normal breath sounds.  Abdominal:     General: Abdomen is flat.     Palpations: Abdomen is soft.  Musculoskeletal:        General: Normal range of motion.     Cervical back: Normal range of motion and neck supple.  Neurological:     General: No focal deficit present.     Mental Status: She is alert and oriented to person, place, and time.  Skin:    General: Skin is warm and dry.  Psychiatric:        Mood and Affect: Mood normal.        Behavior: Behavior normal.    Female chaperone present for pelvic and breast  portions of the physical exam  Results:  PHQ-9: 0   Assessment: 25 y.o. G74P0101 female here for routine annual gynecologic examination Abnormal pap smear in 2020- LGSIL  Plan: Problem List Items Addressed This Visit   None Visit Diagnoses     Routine screening for STI (sexually transmitted infection)    -  Primary   Relevant Orders   Cervicovaginal ancillary only   Women's annual routine gynecological examination       Cervical cancer screening       Relevant Orders   Cytology - PAP       Screening: -- Blood pressure screen normal -- Weight screening: normal -- Depression screening negative (PHQ-9) -- Nutrition: normal -- cholesterol screening: not due for screening -- osteoporosis screening: not due -- tobacco screening: using: discussed quitting using the 5 A's -- alcohol screening: AUDIT questionnaire indicates low-risk usage. -- family history of breast cancer screening: done. not at high risk. -- no evidence of domestic violence or intimate partner violence. -- STD screening: gonorrhea/chlamydia  NAAT collected -- pap smear collected per ASCCP guidelines -- flu vaccine  declines -- HPV vaccination series:  yes Will RTC for any abnormal pap follow up or in one year. Mirna Mires, CNM  01/29/2021 1:52 PM   01/29/2021 1:51 PM

## 2021-02-02 LAB — CERVICOVAGINAL ANCILLARY ONLY
Bacterial Vaginitis (gardnerella): POSITIVE — AB
Chlamydia: NEGATIVE
Comment: NEGATIVE
Comment: NEGATIVE
Comment: NEGATIVE
Comment: NORMAL
Neisseria Gonorrhea: NEGATIVE
Trichomonas: NEGATIVE

## 2021-02-03 ENCOUNTER — Encounter: Payer: Self-pay | Admitting: Obstetrics and Gynecology

## 2021-02-04 ENCOUNTER — Encounter: Payer: Self-pay | Admitting: Obstetrics

## 2021-02-04 DIAGNOSIS — N760 Acute vaginitis: Secondary | ICD-10-CM

## 2021-02-04 DIAGNOSIS — B9689 Other specified bacterial agents as the cause of diseases classified elsewhere: Secondary | ICD-10-CM

## 2021-02-04 LAB — CYTOLOGY - PAP: Diagnosis: NEGATIVE

## 2021-02-04 MED ORDER — METRONIDAZOLE 500 MG PO TABS: 500.0000 mg | ORAL_TABLET | Freq: Two times a day (BID) | ORAL | 0 refills | Status: AC

## 2021-02-08 DIAGNOSIS — H5213 Myopia, bilateral: Secondary | ICD-10-CM | POA: Diagnosis not present

## 2021-02-24 DIAGNOSIS — H5213 Myopia, bilateral: Secondary | ICD-10-CM | POA: Diagnosis not present

## 2021-02-28 NOTE — L&D Delivery Note (Signed)
Obstetrical Delivery Note   Date of Delivery:   01/28/2022 Primary OB:    OB GYN  Gestational Age/EDD: [redacted]w[redacted]d (Dated by L and 9wkUS) Reason for Admission: Labor  Antepartum complications: gestational diabetes, anxiety/depression, PTSD, hx 29wkbirth   Delivered By:   Siri Cole, CNM   Delivery Type:   spontaneous vaginal delivery  Delivery Details:   Contractions started around 4am.Arrived 7cm. SROM for clear at 1148. She began spontaneously bearing then soon after. Using various positions she made consistent progress. While reclined back and pulling on a bed sheet she had a SVB of a viable female at 78. Infant birthed OA to ROA to OP then to ROA, followed by tight but easy shoulders. Infant placed on mother's abdomen, HR at 100, cry with stimulation, slow to pink up. Cord clamped and cut by dad-cord no longer pulsating, infant taken to warmer for assessment-see notes.  Placenta delivered by maternal effort. On inspection of perineum a first degree vaginal laceration found, repaired with 3-0 vicryl and a periurethral was repaired with 4-0 Vicryl.  Lidocaine given prior to repair.  All areas hemostatic. 2-3cm hematoma present on Right labia, stable at this time. Infant placed back skin to skin, both mom and baby stable.  Anesthesia:    local and Nitrous  Intrapartum complications: None GBS:    Negative Laceration:    vaginal and periurethral Episiotomy:    none Rectal exam:   deferred Placenta:    Delivered and expressed via active management. Intact: yes. To pathology: no.  Delayed Cord Clamping: yes Estimated Blood Loss:   Baby:    Liveborn female, APGARs 6/7, weight pending gm  Carie Caddy, CNM  [facility]  01/29/2022 1:55 PM

## 2021-03-29 DIAGNOSIS — F4312 Post-traumatic stress disorder, chronic: Secondary | ICD-10-CM | POA: Diagnosis not present

## 2021-04-26 DIAGNOSIS — F4312 Post-traumatic stress disorder, chronic: Secondary | ICD-10-CM | POA: Diagnosis not present

## 2021-05-13 ENCOUNTER — Ambulatory Visit: Payer: Medicaid Other | Admitting: Obstetrics

## 2021-05-13 ENCOUNTER — Other Ambulatory Visit: Payer: Self-pay

## 2021-05-13 VITALS — BP 118/74 | Ht 60.0 in | Wt 110.0 lb

## 2021-05-13 DIAGNOSIS — Z3046 Encounter for surveillance of implantable subdermal contraceptive: Secondary | ICD-10-CM

## 2021-05-13 NOTE — Progress Notes (Signed)
?  GYNECOLOGY PROCEDURE NOTE ? ? ?Bethany Williams is here today requesting removal of Nexplanon. She desires to stop contracepting and to conceive in the near future. Patient has already started on a prenatal vitamin.  ? ?Nexplanon removal discussed in detail.  Risks of infection, bleeding, nerve injury all reviewed.  Patient understands risks and desires to proceed.  Verbal consent obtained.  Patient is certain she wants the Nexplanon removed.  All questions answered. ? ?Procedure: ?Patient placed in dorsal supine with right arm above head, elbow flexed at 90 degrees, arm resting on examination table.  Nexplanon identified without problems.  Betadine scrub x3. 1 ml of 1% lidocaine injected under Nexplanon device without problems.  Sterile gloves applied.  Small 0.5cm incision made at distal tip of Nexplanon device with 11 blade scalpel.  Nexplanon brought to incision and grasped with a small kelly clamp.  Nexplanon removed intact without problems.  Pressure applied to incision.  Hemostasis obtained.  Steri-strips applied, followed by bandage and compression dressing.  Patient tolerated procedure well.  No complications. ? ? ?Assessment: ?26 y.o. year old female now s/p uncomplicated Nexplanon removal. ? ?Plan: ?1.  Patient given post procedure precautions and asked to call for fever, chills, redness or drainage from her incision, bleeding from incision.  She understands she will likely have a small bruise near site of removal and can remove bandage tomorrow and steri-strips in approximately 1 week. ? ?2) Contraception: patient does not wish to contracept currently as patient desires pregnancy. ? ?3) Fertility awareness discussed, patient is currently tracking her cycles. ? ?4) Patient is currently taking a prenatal vitamin.  ? ?Lamont Snowball, SNM ?Mirna Mires, CNM  ?05/13/2021 4:59 PM  ?  ?

## 2021-05-24 DIAGNOSIS — F4312 Post-traumatic stress disorder, chronic: Secondary | ICD-10-CM | POA: Diagnosis not present

## 2021-05-24 DIAGNOSIS — R41840 Attention and concentration deficit: Secondary | ICD-10-CM | POA: Diagnosis not present

## 2021-06-09 NOTE — Progress Notes (Deleted)
New OB Intake ? ?I connected with  Bethany Williams on 06/09/21 at  2:15 PM EDT by {Contact:[id number]} Telephone Visit and verified that I am speaking with the correct person using two identifiers. Nurse is located at [facility] and pt is located at ***. ? ?I discussed the limitations, risks, security and privacy concerns of performing an evaluation and management service by telephone and the availability of in person appointments. I also discussed with the patient that there may be a patient responsible charge related to this service. The patient expressed understanding and agreed to proceed. ? ?I explained I am completing New OB Intake today. We discussed her EDD of *** that is based on LMP of ***. Pt is G***/P***. I reviewed her allergies, medications, Medical/Surgical/OB history, and appropriate screenings. I informed her of [facility] services. Based on history, this is a/an  pregnancy {Complicated/Uncomplicated [id number] .  ? ?Patient Active Problem List  ? Diagnosis Date Noted  ? History of abnormal cervical Pap smear 01/29/2021  ? History of preterm delivery 01/29/2021  ? Headache disorder 08/17/2018  ? ? ?Concerns addressed today ? ?Delivery Plans:  ?Plans to deliver at [facility] L&D.  ? ?Weight scale: Patient does / does not  have weight scale.  ? ?Anatomy US ?Explained first scheduled [place] will be around 19 weeks. Anatomy US scheduled for *** at ***. Pt notified to arrive at *** ? ?Labs ?Discussed MaterniT-21 genetic screening with patient. Would like drawn at new OB visit.Routine prenatal labs needed. ? ?Covid Vaccine ?Patient {HAS/HAS NOT:20194} covid vaccine.   ? ?Social Determinants of Health ?Food Insecurity: {food:25541} ?WIC Referral: Patient {ACTION; IS/IS GI:[id number] interested in referral to [facility].  ?Transportation: {transportation:25542} ?Childcare: Discussed no children allowed at ultrasound appointments.  ? ?Placed OB Box on problem list and updated ? ?First visit review ?I  reviewed new OB appt with pt. I explained she will have a pelvic exam, ob bloodwork with genetic screening, and PAP smear. Explained pt will be seen by *** at first visit; encounter routed to appropriate provider.   ?Cleophas Dunker, [facility]) ?06/09/2021  3:45 PM  ?

## 2021-06-10 ENCOUNTER — Ambulatory Visit (INDEPENDENT_AMBULATORY_CARE_PROVIDER_SITE_OTHER): Payer: Medicaid Other

## 2021-06-10 DIAGNOSIS — Z3480 Encounter for supervision of other normal pregnancy, unspecified trimester: Secondary | ICD-10-CM | POA: Insufficient documentation

## 2021-06-10 DIAGNOSIS — Z3689 Encounter for other specified antenatal screening: Secondary | ICD-10-CM

## 2021-06-10 NOTE — Initial Assessments (Signed)
New OB Intake ? ?I connected with  Bethany Williams on 06/10/21 at  2:15 PM EDT by telephone Telephone Visit and verified that I am speaking with the correct person using two identifiers. Nurse is located at [facility] and pt is located at home. ? ?II explained I am completing New OB Intake today. We discussed her EDD of 02/15/2022 that is based on LMP of 05/11/2021. Pt is G2/P1. I reviewed her allergies, medications, Medical/Surgical/OB history, and appropriate screenings. I informed her of [facility] services. Based on history, this is a/an  pregnancy uncomplicated .  ? ?Patient Active Problem List  ? Diagnosis Date Noted  ? Supervision of other normal pregnancy, antepartum 06/10/2021  ? History of abnormal cervical Pap smear 01/29/2021  ? History of preterm delivery 01/29/2021  ? Headache disorder 08/17/2018  ? ? ?Concerns addressed today ? ?Delivery Plans:  ?Plans to deliver at [facility] L&D.  ? ?Anatomy US ?Explained first scheduled [place] will be scheduled at her next appointment. ? ?Covid Vaccine ?Patient has covid vaccine.   ? ?Social Determinants of Health ?Food Insecurity: Patient denies food insecurity.  ?Transportation: Patient denies transportation needs. ? ?Placed OB Box on problem list and updated ? ?First visit review ?I reviewed new OB appt with pt. I explained she will have a pelvic exam, ob bloodwork with genetic screening, and PAP smear. Explained pt will be seen by Rod Can, CNM at first visit; encounter routed to appropriate provider.   ?Cleophas Dunker, [facility]) ?06/10/2021  4:43 PM  ?

## 2021-06-10 NOTE — Initial Assessments (Signed)
New OB Intake ? ?I connected with  Bethany Williams on 06/10/21 at  2:15 PM EDT by telephone Telephone Visit and verified that I am speaking with the correct person using two identifiers. Nurse is located at [facility] and pt is located at home. ? ?II explained I am completing New OB Intake today. We discussed her EDD of 02/15/2022 that is based on LMP of 05/11/2021. Pt is G2/P1. I reviewed her allergies, medications, Medical/Surgical/OB history, and appropriate screenings. I informed her of [facility] services. Based on history, this is a/an  pregnancy uncomplicated .  ? ?Patient Active Problem List  ? Diagnosis Date Noted  ? Supervision of other normal pregnancy, antepartum 06/10/2021  ? History of abnormal cervical Pap smear 01/29/2021  ? History of preterm delivery 01/29/2021  ? Headache disorder 08/17/2018  ? ? ?Concerns addressed today ? ?Delivery Plans:  ?Plans to deliver at [facility] L&D.  ? ?Anatomy US ?Explained first scheduled [place] will be scheduled at her next appointment. ? ?Covid Vaccine ?Patient has covid vaccine.   ? ?Social Determinants of Health ?Food Insecurity: Patient denies food insecurity.  ?Transportation: Patient denies transportation needs. ? ?Placed OB Box on problem list and updated ? ?First visit review ?I reviewed new OB appt with pt. I explained she will have a pelvic exam, ob bloodwork with genetic screening, and PAP smear. Explained pt will be seen by Tresea Mall, CNM at first visit; encounter routed to appropriate provider.   ?Loran Senters, [facility]) ?06/10/2021  2:56 PM  ?

## 2021-06-15 NOTE — Progress Notes (Signed)
New OB Intake ? ?I connected with  Glenna Fellows on 06/15/21 at  2:15 PM EDT by telephone Telephone Visit and verified that I am speaking with the correct person using two identifiers. Nurse is located at [facility] and pt is located at work. ? ?I explained I am completing New OB Intake today. We discussed her EDD of 02/15/2022 that is based on LMP of 05/11/2021. Pt is G2/P1. I reviewed her allergies, medications, Medical/Surgical/OB history, and appropriate screenings. I informed her of [facility] services. Based on history, this is a/an  pregnancy uncomplicated .  ? ?Patient Active Problem List  ? Diagnosis Date Noted  ? Supervision of other normal pregnancy, antepartum 06/10/2021  ? History of abnormal cervical Pap smear 01/29/2021  ? History of preterm delivery 01/29/2021  ? Headache disorder 08/17/2018  ? ? ?Concerns addressed today ? ?Delivery Plans:  ?Plans to deliver at [facility] L&D.  ? ? [place] ?Explained first scheduled [place] will be scheduled at her first visit with provider. ? ?Labs ?Pt aware NOB labs will be drawn at first visit with provider. ? ?Patient has covid vaccine.   ? ?Social Determinants of Health ?Food Insecurity: Patient denies food insecurity.  ?Transportation: Patient denies transportation needs. ? ?Placed OB Box on problem list and updated ? ?First visit review ?I reviewed new OB appt with pt. I explained she will have a pelvic exam, ob bloodwork with genetic screening, and PAP smear. Explained pt will be seen by Tresea Mall, CNM at first visit; encounter routed to appropriate provider.  ?  ?Loran Senters, [facility]) ?06/15/2021  10:23 AM  ?

## 2021-06-15 NOTE — Progress Notes (Signed)
NOB Intake documentation is under 'notes'.

## 2021-06-25 ENCOUNTER — Other Ambulatory Visit: Payer: Self-pay | Admitting: Licensed Practical Nurse

## 2021-06-25 ENCOUNTER — Encounter: Payer: Self-pay | Admitting: Advanced Practice Midwife

## 2021-06-25 DIAGNOSIS — R112 Nausea with vomiting, unspecified: Secondary | ICD-10-CM

## 2021-06-25 MED ORDER — ONDANSETRON 4 MG PO TBDP: 4.0000 mg | ORAL_TABLET | Freq: Four times a day (QID) | ORAL | 0 refills | Status: DC | PRN

## 2021-06-25 NOTE — Telephone Encounter (Signed)
Can you send in nausea meds?

## 2021-07-01 ENCOUNTER — Encounter: Payer: Self-pay | Admitting: Advanced Practice Midwife

## 2021-07-01 ENCOUNTER — Ambulatory Visit (INDEPENDENT_AMBULATORY_CARE_PROVIDER_SITE_OTHER): Payer: Medicaid Other | Admitting: Advanced Practice Midwife

## 2021-07-01 ENCOUNTER — Other Ambulatory Visit (HOSPITAL_COMMUNITY)
Admission: RE | Admit: 2021-07-01 | Discharge: 2021-07-01 | Disposition: A | Discharge status: Home / Self Care | Payer: Medicaid Other | Source: Ambulatory Visit | Attending: Advanced Practice Midwife | Admitting: Advanced Practice Midwife

## 2021-07-01 VITALS — BP 120/80 | Wt 109.0 lb

## 2021-07-01 DIAGNOSIS — Z3480 Encounter for supervision of other normal pregnancy, unspecified trimester: Secondary | ICD-10-CM | POA: Insufficient documentation

## 2021-07-01 DIAGNOSIS — Z369 Encounter for antenatal screening, unspecified: Secondary | ICD-10-CM | POA: Insufficient documentation

## 2021-07-01 DIAGNOSIS — Z113 Encounter for screening for infections with a predominantly sexual mode of transmission: Secondary | ICD-10-CM | POA: Diagnosis not present

## 2021-07-01 DIAGNOSIS — Z3A01 Less than 8 weeks gestation of pregnancy: Secondary | ICD-10-CM

## 2021-07-01 DIAGNOSIS — Z1159 Encounter for screening for other viral diseases: Secondary | ICD-10-CM | POA: Diagnosis not present

## 2021-07-01 LAB — POCT URINALYSIS DIPSTICK OB
Glucose, UA: NEGATIVE
POC,PROTEIN,UA: NEGATIVE

## 2021-07-01 LAB — POCT URINE PREGNANCY: Preg Test, Ur: POSITIVE — AB

## 2021-07-01 NOTE — Patient Instructions (Signed)
Exercise During Pregnancy ?Exercise is an important part of being healthy for people of all ages. Exercise improves the function of your heart and lungs and helps you maintain strength, flexibility, and a healthy body weight. Exercise also boosts energy levels and elevates mood. ?Most women should exercise regularly during pregnancy. Exercise routines may need to change as your pregnancy progresses. In rare cases, women with certain medical conditions or complications may be asked to limit or avoid exercise during pregnancy. Your health care provider will give you information on what will work for you. ?How does this affect me? ?Along with maintaining general strength and flexibility, exercising during pregnancy can help: ?Keep strength in muscles that are used during labor and childbirth. ?Decrease low back pain or symptoms of depression. ?Control weight gain during pregnancy. ?Reduce the risk of needing insulin if you develop diabetes during pregnancy. ?Decrease the risk of cesarean delivery. ?Speed up your recovery after giving birth. ?Relieve constipation. ?How does this affect my baby? ?Exercise can help you have a healthy pregnancy. Exercise does not cause early (premature) birth. It will not cause your baby to weigh less at birth. ?What exercises can I do? ?Many exercises are safe for you to do during pregnancy. Do a variety of exercises that safely increase your heart and breathing rates and help you build and maintain muscle strength. Do exercises exactly as told by your health care provider. You may do these exercises: ?Walking. ?Swimming. ?Water aerobics. ?Riding a stationary bike. ?Modified yoga or Pilates. Tell your instructor that you are pregnant. Avoid overstretching, and avoid lying on your back for long periods of time. ?Running or jogging. Choose this type of exercise only if: ?You ran or jogged regularly before your pregnancy. ?You can run or jog and still talk in complete sentences. ?What  exercises should I avoid? ?You may be told to limit high-intensity exercise depending on your level of fitness and whether you exercised regularly before you were pregnant. You can tell that you are exercising at a high intensity if you are breathing much harder and faster and cannot hold a conversation while exercising. You must avoid: ?Contact sports. ?Activities that put you at risk for falling on or being hit in the belly, such as downhill skiing, waterskiing, surfing, rock climbing, cycling, gymnastics, and horseback riding. ?Scuba diving. ?Skydiving. ?Hot yoga or hot Pilates. These activities take place in a room that is heated to high temperatures. ?Jogging or running, unless you jogged or ran regularly before you were pregnant. While jogging or running, you should always be able to talk in full sentences. Do not run or jog so fast that you are unable to have a conversation. ?Do not exercise at more than 6,000 feet above sea level (high elevation) if you are not used to exercising at high elevation. ?How do I exercise in a safe way? ? ?Avoid overheating. Do not exercise in very high temperatures. ?Wear loose-fitting, breathable clothes. ?Avoid dehydration. Drink enough fluid before, during, and after exercise to keep your urine pale yellow. ?Avoid overstretching. Because of hormone changes during pregnancy, it is easy to overstretch muscles, tendons, and ligaments. ?Start slowly and ask your health care provider to recommend the types of exercise that are safe for you. ?Do not exercise to lose weight. ?Wear a sports bra to support your breasts. ?Avoid standing still or lying flat on your back as much as you can. ?Follow these instructions at home: ?Exercise on most days or all days of the week. Try to   exercise for 30 minutes a day, 5 days a week, unless your health care provider tells you not to. ?If you actively exercised before your pregnancy and you are healthy, your health care provider may tell you to  continue to do moderate-intensity to high-intensity exercise. ?If you are just starting to exercise or did not exercise much before your pregnancy, your health care provider may tell you to do low-intensity to moderate-intensity exercise. ?Questions to ask your health care provider ?Is exercise safe for me? ?What are signs that I should stop exercising? ?Does my health condition mean that I should not exercise during pregnancy? ?When should I avoid exercising during pregnancy? ?Stop exercising and contact a health care provider if: ?You have any unusual symptoms such as: ?Mild contractions of the uterus or cramps in the abdomen. ?A dizzy feeling that does not go away when you rest. ?Stop exercising and get help right away if: ?You have any unusual symptoms such as: ?Sudden, severe pain in your low back or your belly. ?Regular, painful contractions of your uterus. ?Chest pain. ?Bleeding or fluid leaking from your vagina. ?Shortness of breath. ?Headache. ?Pain and swelling of your calves. ?Summary ?Most women should exercise regularly throughout pregnancy. In rare cases, women with certain medical conditions or complications may be asked to limit or avoid exercise during pregnancy. ?Do not exercise to lose weight during pregnancy. ?Your health care provider will tell you what level of physical activity is right for you. ?Stop exercising and contact a health care provider if you have unusual symptoms, such as mild contractions or dizziness. ?This information is not intended to replace advice given to you by your health care provider. Make sure you discuss any questions you have with your health care provider. ?Document Revised: 10/02/2019 Document Reviewed: 10/02/2019 ?Elsevier Patient Education ? 2023 Elsevier Inc. ?Eating Plan for Pregnant Women ?While you are pregnant, your body requires additional nutrition to help support your growing baby. You also have a higher need for some vitamins and minerals, such as folic  acid, calcium, iron, and vitamin D. Eating a healthy, well-balanced diet is very important for your health and your baby's health. Your need for extra calories varies over the course of your pregnancy. Pregnancy is divided into three trimesters, with each trimester lasting 3 months. For most women, it is recommended to consume: ?150 extra calories a day during the first trimester. ?300 extra calories a day during the second trimester. ?300 extra calories a day during the third trimester. ?What are tips for following this plan? ?Cooking ?Practice good food safety and cleanliness. Wash your hands before you eat and after you prepare raw meat. Wash all fruits and vegetables well before peeling or eating. Taking these actions can help to prevent foodborne illnesses that can be very dangerous to your baby, such as listeriosis. Ask your health care provider for more information about listeriosis. ?Make sure that all meats, poultry, and eggs are cooked to food-safe temperatures or "well-done." ?Meal planning ? ?Eat a variety of foods (especially fruits and vegetables) to get a full range of vitamins and minerals. ?Two or more servings of fish are recommended each week in order to get the most benefits from omega-3 fatty acids that are found in seafood. Choose fish that are lower in mercury, such as salmon and pollock. ?Limit your overall intake of foods that have "empty calories." These are foods that have little nutritional value, such as sweets, desserts, candies, and sugar-sweetened beverages. ?Drinks that contain caffeine are okay   to drink, but it is better to avoid caffeine. Keep your total caffeine intake to less than 200 mg each day (which is 12 oz or 355 mL of coffee, tea, or soda) or the limit as told by your health care provider. ?General information ?Do not try to lose weight or go on a diet during pregnancy. ?Take a prenatal vitamin to help meet your additional vitamin and mineral needs during pregnancy,  specifically for folic acid, iron, calcium, and vitamin D. ?Remember to stay active. Ask your health care provider what types of exercise and activities are safe for you. ?What does 150 extra calories look like? ?H

## 2021-07-01 NOTE — Progress Notes (Signed)
? ?New Obstetric Patient H&P  ? ? ?Chief Complaint: "Desires prenatal care" ? ? ?History of Present Illness: Patient is a 26 y.o. G2P0101 Not Hispanic or Latino female, presents with amenorrhea and positive home pregnancy test. Patient's last menstrual period was 05/11/2021 (approximate). and based on her  LMP, her EDD is Estimated Date of Delivery: 02/15/22 and her EGA is 782w2d. Cycles are 3-4 days, regular, and occur approximately every : 28 days. Her last pap smear was 1 years ago and was no abnormalities.  ?  ?She had a urine pregnancy test which was positive 3 week(s)  ago. Her last menstrual period was normal and lasted for  3 or 4 day(s). Since her LMP she claims she has experienced breast tenderness, fatigue, nausea, vomiting. She denies vaginal bleeding. Her past medical history is noncontributory. Her prior pregnancies are notable for 29 week labor with suspected placental abruption (no bleeding). ? ?Since her LMP, she admits to the use of tobacco products  no she quit vaping ?She claims she has gained  1  pounds since the start of her pregnancy.  ?There are cats in the home in the home  no  ?She admits close contact with children on a regular basis  yes  ?She has had chicken pox in the past no ?She has had Tuberculosis exposures, symptoms, or previously tested positive for TB   no ?Current or past history of domestic violence. no ? ?Genetic Screening/Teratology Counseling: (Includes patient, baby's father, or anyone in either family with:)  ? ?1. [facility]/= 7[facility]  no ?2. Thalassemia ([place], [place], Mediterranean, or Asian background): MCV<80  no ?3. Neural tube defect (meningomyelocele, spina bifida, anencephaly)  no ?4. Congenital heart defect  no  ?5. Down syndrome  no ?6. Tay-Sachs (Jewish, [place])  no ?7. Canavan's Disease  no ?8. Sickle cell disease or trait (African)  no  ?9. Hemophilia or other blood disorders  no  ?10. Muscular dystrophy  no  ?11. Cystic fibrosis  no  ?12.  Huntington's Chorea  no  ?13. Mental retardation/autism  her daughter has autism ?14. Other inherited genetic or chromosomal disorder  no ?15. Maternal metabolic disorder (DM, PKU, etc)  no ?16. Patient or FOB with a child with a birth defect not listed above no  ?16a. Patient or FOB with a birth defect themselves no ?17. Recurrent pregnancy loss, or stillbirth  no  ?18. Any medications since LMP other than prenatal vitamins (include vitamins, supplements, OTC meds, drugs, alcohol)  no ?19. Any other genetic/environmental exposure to discuss  no ? ?Infection History:  ? ?1. Lives with someone with TB or TB exposed  no  ?2. Patient or partner has history of genital herpes  no ?3. Rash or viral illness since LMP  no ?4. History of STI (GC, CT, HPV, syphilis, HIV)  no ?5. History of recent travel :  no ? ?Other pertinent information:  She is planning to update her medicaid to included maternity care. ? ?Review of Systems:10 point review of systems negative unless otherwise noted in HPI ? ?Past Medical History:  ?Patient Active Problem List  ? Diagnosis Date Noted  ? Supervision of other normal pregnancy, antepartum 06/10/2021  ?   ?Nursing Staff Provider  ?Office Location  [facility]    ?Language  English Anatomy US    ?Flu Vaccine   Genetic Screen  NIPS:   ?TDaP vaccine    Hgb A1C or  ?GTT Early : NA ?Third trimester :   ?  Covid    LAB RESULTS   ?Rhogam   Blood Type     ?Feeding Plan Breast Antibody    ?Contraception  Rubella    ?Circumcision  RPR     ?Pediatrician   HBsAg     ?Support Person [facility]    ?Prenatal Classes  Varicella   ?  GBS  (For PCN allergy, check sensitivities)   ?BTL Consent     ?VBAC Consent NA Pap  negative 2022  ?  Hgb Electro    ?Pelvis Tested 3#5oz 29w GA CF   ?   SMA   ?     ? ?  ? History of abnormal cervical Pap smear 01/29/2021  ? History of preterm delivery 01/29/2021  ?  Delivered at 29 weeks  ? ?  ? Headache disorder 08/17/2018  ? ? ?Past Surgical History:  ?Past Surgical  History:  ?Procedure Laterality Date  ? WISDOM TOOTH EXTRACTION    ? ? ?Gynecologic History: Patient's last menstrual period was 05/11/2021 (approximate). ? ?Obstetric History: G2P0101 ? ?Family History:  ?Family History  ?Problem Relation Age of Onset  ? Ovarian cancer Mother 44  ? Heart disease Mother   ? Stroke Mother   ? Hypertension Mother   ? Leukemia Father   ? Diabetes Sister   ? Hypertension Sister   ? Hypertension Maternal Grandmother   ? Diabetes Maternal Grandfather   ? Hypertension Maternal Grandfather   ? Lung cancer Paternal Grandfather   ? ? ?Social History:  ?Social History  ? ?Socioeconomic History  ? Marital status: Single  ?  Spouse name: Not on file  ? Number of children: 1  ? Years of education: 13.5  ? Highest education level: Not on file  ?Occupational History  ? Not on file  ?Tobacco Use  ? Smoking status: Former  ?  Types: E-cigarettes  ?  Quit date: 01/28/2017  ?  Years since quitting: 4.4  ? Smokeless tobacco: Never  ?Vaping Use  ? Vaping Use: Former  ?Substance and Sexual Activity  ? Alcohol use: Not Currently  ? Drug use: Never  ? Sexual activity: Yes  ?  Birth control/protection: None  ?Other Topics Concern  ? Not on file  ?Social History Narrative  ? Not on file  ? ?Social Determinants of Health  ? ?Financial Resource Strain: Low Risk   ? Difficulty of Paying Living Expenses: Not hard at all  ?Food Insecurity: No Food Insecurity  ? Worried About Programme researcher, broadcasting/film/video in the Last Year: Never true  ? Ran Out of Food in the Last Year: Never true  ?Transportation Needs: No Transportation Needs  ? Lack of Transportation (Medical): No  ? Lack of Transportation (Non-Medical): No  ?Physical Activity: Sufficiently Active  ? Days of Exercise per Week: 5 days  ? Minutes of Exercise per Session: 30 min  ?Stress: No Stress Concern Present  ? Feeling of Stress : Not at all  ?Social Connections: Moderately Isolated  ? Frequency of Communication with Friends and Family: More than three times a week  ?  Frequency of Social Gatherings with Friends and Family: More than three times a week  ? Attends Religious Services: Never  ? Active Member of Clubs or Organizations: No  ? Attends Banker Meetings: Never  ? Marital Status: Living with partner  ?Intimate Partner Violence: Not At Risk  ? Fear of Current or Ex-Partner: No  ? Emotionally Abused: No  ? Physically Abused: No  ?  Sexually Abused: No  ? ? ?Allergies:  ?Allergies  ?Allergen Reactions  ? Sulfa Antibiotics Anaphylaxis  ? Dilaudid [Hydromorphone]   ? Latex   ? Ketorolac Rash  ? ? ?Medications: ?Prior to Admission medications   ?Medication Sig Start Date End Date Taking? Authorizing Provider  ?Cholecalciferol (VITAMIN D3) 250 MCG (10000 UT) TABS Take 1 tablet by mouth daily.   Yes [provider]  ?FLUoxetine (PROZAC) 10 MG capsule Take 20 mg by mouth every morning. 01/22/21  Yes [provider]  ?guanFACINE (INTUNIV) 2 MG TB24 ER tablet Take 2 mg by mouth daily. 06/04/21  Yes [provider]  ?lamoTRIgine (LAMICTAL) 100 MG tablet Take 100 mg by mouth daily. 06/04/21  Yes [provider]  ?traZODone (DESYREL) 50 MG tablet CBC 09/07/18  Yes [provider]  ?Prenatal Vit-Fe Fumarate-FA (MULTIVITAMIN-PRENATAL) 27-0.8 MG TABS tablet Take 1 tablet by mouth daily at 12 noon. ?Patient not taking: Reported on 07/01/2021    [provider]  ? ? ?Physical Exam ?Vitals: Blood pressure 120/80, weight 109 lb (49.4 kg), last menstrual period 05/11/2021. ? ?General: NAD ?HEENT: normocephalic, anicteric ?Thyroid: no enlargement, no palpable nodules ?Pulmonary: No increased work of breathing, CTAB ?Cardiovascular: RRR, distal pulses 2+ ?Abdomen: NABS, soft, non-tender, non-distended.  Umbilicus without lesions.  No hepatomegaly, splenomegaly or masses palpable. No evidence of hernia  ?Extremities: no edema, erythema, or tenderness ?Neurologic: Grossly intact ?Psychiatric: mood appropriate, affect full ? ? ?The following  were addressed during this visit: ? ?Breastfeeding Education ?- Early initiation of breastfeeding  ?  Comments: Keeps milk supply adequate, helps contract uterus and slow bleeding, and early milk is the p

## 2021-07-04 LAB — URINE CULTURE

## 2021-07-05 ENCOUNTER — Other Ambulatory Visit: Payer: Self-pay | Admitting: Licensed Practical Nurse

## 2021-07-05 DIAGNOSIS — R112 Nausea with vomiting, unspecified: Secondary | ICD-10-CM

## 2021-07-05 LAB — CERVICOVAGINAL ANCILLARY ONLY
Chlamydia: NEGATIVE
Comment: NEGATIVE
Comment: NEGATIVE
Comment: NORMAL
Neisseria Gonorrhea: NEGATIVE
Trichomonas: NEGATIVE

## 2021-07-07 ENCOUNTER — Other Ambulatory Visit: Payer: Self-pay

## 2021-07-07 ENCOUNTER — Other Ambulatory Visit: Payer: Self-pay | Admitting: Licensed Practical Nurse

## 2021-07-07 DIAGNOSIS — O219 Vomiting of pregnancy, unspecified: Secondary | ICD-10-CM

## 2021-07-07 MED ORDER — ONDANSETRON 4 MG PO TBDP: 4.0000 mg | ORAL_TABLET | Freq: Four times a day (QID) | ORAL | 1 refills | Status: AC | PRN

## 2021-07-07 NOTE — Progress Notes (Signed)
Pt requesting refill on Zofran.  Script sent ?Carie Caddy, CNM  ?Domingo Pulse, [facility]  ?07/07/21  ?10:54 AM  ? ?

## 2021-07-15 ENCOUNTER — Other Ambulatory Visit: Payer: Self-pay | Admitting: Advanced Practice Midwife

## 2021-07-15 ENCOUNTER — Ambulatory Visit (INDEPENDENT_AMBULATORY_CARE_PROVIDER_SITE_OTHER): Payer: Medicaid Other

## 2021-07-15 DIAGNOSIS — Z3480 Encounter for supervision of other normal pregnancy, unspecified trimester: Secondary | ICD-10-CM

## 2021-07-15 DIAGNOSIS — Z369 Encounter for antenatal screening, unspecified: Secondary | ICD-10-CM

## 2021-07-15 DIAGNOSIS — Z3481 Encounter for supervision of other normal pregnancy, first trimester: Secondary | ICD-10-CM

## 2021-07-15 DIAGNOSIS — Z3A09 9 weeks gestation of pregnancy: Secondary | ICD-10-CM

## 2021-07-16 ENCOUNTER — Ambulatory Visit (INDEPENDENT_AMBULATORY_CARE_PROVIDER_SITE_OTHER): Payer: Medicaid Other | Admitting: Licensed Practical Nurse

## 2021-07-16 ENCOUNTER — Encounter: Payer: Medicaid Other | Admitting: Family Medicine

## 2021-07-16 ENCOUNTER — Encounter: Payer: Self-pay | Admitting: Licensed Practical Nurse

## 2021-07-16 VITALS — BP 118/60 | Wt 110.0 lb

## 2021-07-16 DIAGNOSIS — Z0283 Encounter for blood-alcohol and blood-drug test: Secondary | ICD-10-CM | POA: Diagnosis not present

## 2021-07-16 DIAGNOSIS — Z1379 Encounter for other screening for genetic and chromosomal anomalies: Secondary | ICD-10-CM | POA: Diagnosis not present

## 2021-07-16 DIAGNOSIS — Z3480 Encounter for supervision of other normal pregnancy, unspecified trimester: Secondary | ICD-10-CM | POA: Diagnosis not present

## 2021-07-16 NOTE — Progress Notes (Signed)
Routine Prenatal Care Visit  Subjective  Bethany Williams is a 26 y.o. G2P0101 at [redacted]w[redacted]d being seen today for ongoing prenatal care.  She is currently monitored for the following issues for this high-risk pregnancy and has Headache disorder; History of abnormal cervical Pap smear; History of preterm delivery; and Supervision of other normal pregnancy, antepartum on their problem list.  ----------------------------------------------------------------------------------- Patient reports fatigue.  Doing well.  Asking about what the plan is in regards to her hx of abruption, aware recommendations have changed regarding progesterone. Will see MFM for a consult -desires genetic screening, understands it is a little early, will come back if sample not sufficient  Contractions: Not present. Vag. Bleeding: None.  Movement: Absent. Leaking Fluid denies.  ----------------------------------------------------------------------------------- The following portions of the patient's history were reviewed and updated as appropriate: allergies, current medications, past family history, past medical history, past social history, past surgical history and problem list. Problem list updated.  Objective  Blood pressure 118/60, weight 110 lb (49.9 kg), last menstrual period 05/11/2021. Pregravid weight 108 lb (49 kg) Total Weight Gain 2 lb (0.907 kg) Urinalysis: Urine Protein    Urine Glucose    Fetal Status:     Movement: Absent     General:  Alert, oriented and cooperative. Patient is in no acute distress.  Skin: Skin is warm and dry. No rash noted.   Cardiovascular: Normal heart rate noted  Respiratory: Normal respiratory effort, no problems with respiration noted  Abdomen: Soft, gravid, appropriate for gestational age. Pain/Pressure: Absent     Pelvic:  Cervical exam deferred        Extremities: Normal range of motion.     Mental Status: Normal mood and affect. Normal behavior. Normal judgment and thought content.    Assessment   26 y.o. G2P0101 at [redacted]w[redacted]d by  02/15/2022, by Last Menstrual Period presenting for routine prenatal visit  Plan   SECOND Problems (from 06/10/21 to present)     Problem Noted Resolved   Supervision of other normal pregnancy, antepartum 06/10/2021 by Bethany Williams, Bethany Williams No   Overview Addendum 07/01/2021  5:08 PM by Bethany Williams, Bethany Williams     Nursing Staff Provider  Office Location  [facility] Dating    Language  English Anatomy US    Flu Vaccine   Genetic Screen  NIPS:   TDaP vaccine    Hgb A1C or  GTT Early : NA Third trimester :   Covid    LAB RESULTS   Rhogam   Blood Type     Feeding Plan Breast Antibody    Contraception  Rubella    Circumcision  RPR     Pediatrician   HBsAg     Support Person Bethany Williams HIV    Prenatal Classes  Varicella     GBS  (For PCN allergy, check sensitivities)   BTL Consent     VBAC Consent NA Pap  negative 2022    Hgb Electro    Pelvis Tested 3#5oz 29w GA CF      SMA                    general obstetric precautions including but not limited to vaginal bleeding, contractions, leaking of fluid and fetal movement were reviewed in detail with the patient. Please refer to After Visit Summary for other counseling recommendations.   Return in about 4 weeks (around 08/13/2021) for ROB.  Referral for MFM consult placed   NOB labs collected   Bethany Williams, Bethany Williams  Bethany Williams, Bethany Williams  07/16/21  4:21 PM

## 2021-07-20 ENCOUNTER — Other Ambulatory Visit: Payer: Self-pay

## 2021-07-20 DIAGNOSIS — O09293 Supervision of pregnancy with other poor reproductive or obstetric history, third trimester: Secondary | ICD-10-CM

## 2021-07-21 LAB — URINE DRUG PANEL 7
Amphetamines, Urine: NEGATIVE ng/mL
Barbiturate Quant, Ur: NEGATIVE ng/mL
Benzodiazepine Quant, Ur: NEGATIVE ng/mL
Cannabinoid Quant, Ur: POSITIVE — AB
Cocaine (Metab.): NEGATIVE ng/mL
Opiate Quant, Ur: NEGATIVE ng/mL
PCP Quant, Ur: NEGATIVE ng/mL

## 2021-07-22 LAB — MATERNIT 21 PLUS CORE, BLOOD
Fetal Fraction: 10
Result (T21): NEGATIVE
Trisomy 13 (Patau syndrome): NEGATIVE
Trisomy 18 (Edwards syndrome): NEGATIVE
Trisomy 21 (Down syndrome): NEGATIVE

## 2021-07-31 LAB — INHERITEST(R) CORE PANEL

## 2021-08-13 ENCOUNTER — Encounter: Payer: Medicaid Other | Admitting: Obstetrics

## 2021-08-13 ENCOUNTER — Other Ambulatory Visit: Payer: Self-pay

## 2021-08-13 ENCOUNTER — Ambulatory Visit (INDEPENDENT_AMBULATORY_CARE_PROVIDER_SITE_OTHER): Payer: Medicaid Other | Admitting: Licensed Practical Nurse

## 2021-08-13 VITALS — BP 118/62 | Wt 110.0 lb

## 2021-08-13 DIAGNOSIS — Z113 Encounter for screening for infections with a predominantly sexual mode of transmission: Secondary | ICD-10-CM | POA: Diagnosis not present

## 2021-08-13 DIAGNOSIS — Z3480 Encounter for supervision of other normal pregnancy, unspecified trimester: Secondary | ICD-10-CM | POA: Diagnosis not present

## 2021-08-13 DIAGNOSIS — Z1159 Encounter for screening for other viral diseases: Secondary | ICD-10-CM

## 2021-08-13 DIAGNOSIS — Z3A13 13 weeks gestation of pregnancy: Secondary | ICD-10-CM

## 2021-08-13 DIAGNOSIS — Z369 Encounter for antenatal screening, unspecified: Secondary | ICD-10-CM | POA: Diagnosis not present

## 2021-08-13 LAB — POCT URINALYSIS DIPSTICK OB
Glucose, UA: NEGATIVE
POC,PROTEIN,UA: NEGATIVE

## 2021-08-13 NOTE — Progress Notes (Signed)
Routine Prenatal Care Visit  Subjective  Bethany Williams is a 26 y.o. G2P0101 at [redacted]w[redacted]d being seen today for ongoing prenatal care.  She is currently monitored for the following issues for this low-risk pregnancy and has Headache disorder; History of abnormal cervical Pap smear; History of preterm delivery; and Supervision of other normal pregnancy, antepartum on their problem list.  ----------------------------------------------------------------------------------- Patient reports nausea otherwise doing well. Encouraged CBE.  Traveling to [place] in Sept to get married! Contractions: Not present. Vag. Bleeding: None.  Movement: Absent. Leaking Fluid denies.  ----------------------------------------------------------------------------------- The following portions of the patient's history were reviewed and updated as appropriate: allergies, current medications, past family history, past medical history, past social history, past surgical history and problem list. Problem list updated.  Objective  Blood pressure 118/62, weight 110 lb (49.9 kg), last menstrual period 05/11/2021. Pregravid weight 108 lb (49 kg) Total Weight Gain 2 lb (0.907 kg) Urinalysis: Urine Protein Negative  Urine Glucose Negative  Fetal Status: Fetal Heart Rate (bpm): 145   Movement: Absent     General:  Alert, oriented and cooperative. Patient is in no acute distress.  Skin: Skin is warm and dry. No rash noted.   Cardiovascular: Normal heart rate noted  Respiratory: Normal respiratory effort, no problems with respiration noted  Abdomen: Soft, gravid, appropriate for gestational age. Pain/Pressure: Absent     Pelvic:  Cervical exam deferred        Extremities: Normal range of motion.     Mental Status: Normal mood and affect. Normal behavior. Normal judgment and thought content.   Assessment   26 y.o. G2P0101 at [redacted]w[redacted]d by  02/15/2022, by Last Menstrual Period presenting for routine prenatal visit  Plan   SECOND  Problems (from 06/10/21 to present)     Problem Noted Resolved   Supervision of other normal pregnancy, antepartum 06/10/2021 by Loran Senters, CMA No   Overview Addendum 07/01/2021  5:08 PM by Tresea Mall, CNM     Nursing Staff Provider  Office Location  [facility] Dating    Language  English Anatomy US    Flu Vaccine   Genetic Screen  NIPS:   TDaP vaccine    Hgb A1C or  GTT Early : NA Third trimester :   Covid    LAB RESULTS   Rhogam   Blood Type     Feeding Plan Breast Antibody    Contraception  Rubella    Circumcision  RPR     Pediatrician   HBsAg     Support Person [facility] HIV    Prenatal Classes  Varicella     GBS  (For PCN allergy, check sensitivities)   BTL Consent     VBAC Consent NA Pap  negative 2022    Hgb Electro    Pelvis Tested 3#5oz 29w GA CF      SMA                    general obstetric precautions including but not limited to vaginal bleeding, contractions, leaking of fluid and fetal movement were reviewed in detail with the patient. Please refer to After Visit Summary for other counseling recommendations.   Return in about 4 weeks (around 09/10/2021) for ROB.  NOB labs collected today   Anatomy US ordered  Carie Caddy, CNM  [facility]  08/13/21  5:04 PM

## 2021-08-14 LAB — HCV INTERPRETATION

## 2021-08-14 LAB — CBC/D/PLT+RPR+RH+ABO+RUBIGG...
Antibody Screen: NEGATIVE
Basophils Absolute: 0 10*3/uL (ref 0.0–0.2)
Basos: 0 %
EOS (ABSOLUTE): 0.1 10*3/uL (ref 0.0–0.4)
Eos: 1 %
HCV Ab: NONREACTIVE
HIV Screen 4th Generation wRfx: NONREACTIVE
Hematocrit: 33.1 % — ABNORMAL LOW (ref 34.0–46.6)
Hemoglobin: 11.5 g/dL (ref 11.1–15.9)
Hepatitis B Surface Ag: NEGATIVE
Immature Grans (Abs): 0 10*3/uL (ref 0.0–0.1)
Immature Granulocytes: 0 %
Lymphocytes Absolute: 3.3 10*3/uL — ABNORMAL HIGH (ref 0.7–3.1)
Lymphs: 25 %
MCH: 31 pg (ref 26.6–33.0)
MCHC: 34.7 g/dL (ref 31.5–35.7)
MCV: 89 fL (ref 79–97)
Monocytes Absolute: 0.6 10*3/uL (ref 0.1–0.9)
Monocytes: 5 %
Neutrophils Absolute: 9 10*3/uL — ABNORMAL HIGH (ref 1.4–7.0)
Neutrophils: 69 %
Platelets: 283 10*3/uL (ref 150–450)
RBC: 3.71 x10E6/uL — ABNORMAL LOW (ref 3.77–5.28)
RDW: 12.9 % (ref 11.7–15.4)
RPR Ser Ql: NONREACTIVE
Rh Factor: POSITIVE
Rubella Antibodies, IGG: 2.54 index (ref >0.99)
Varicella zoster IgG: <135 index — ABNORMAL LOW (ref >165)
WBC: 13.1 10*3/uL — ABNORMAL HIGH (ref 3.4–10.8)

## 2021-09-16 ENCOUNTER — Ambulatory Visit
Admission: RE | Admit: 2021-09-16 | Discharge: 2021-09-16 | Disposition: A | Discharge status: Home / Self Care | Payer: Medicaid Other | Source: Ambulatory Visit | Attending: Licensed Practical Nurse | Admitting: Licensed Practical Nurse

## 2021-09-16 ENCOUNTER — Ambulatory Visit: Payer: Medicaid Other

## 2021-09-16 ENCOUNTER — Ambulatory Visit (HOSPITAL_BASED_OUTPATIENT_CLINIC_OR_DEPARTMENT_OTHER): Payer: Medicaid Other | Admitting: Obstetrics

## 2021-09-16 ENCOUNTER — Other Ambulatory Visit: Payer: Self-pay | Admitting: Licensed Practical Nurse

## 2021-09-16 DIAGNOSIS — O358XX0 Maternal care for other (suspected) fetal abnormality and damage, not applicable or unspecified: Secondary | ICD-10-CM | POA: Diagnosis not present

## 2021-09-16 DIAGNOSIS — O09212 Supervision of pregnancy with history of pre-term labor, second trimester: Secondary | ICD-10-CM | POA: Diagnosis not present

## 2021-09-16 DIAGNOSIS — O09292 Supervision of pregnancy with other poor reproductive or obstetric history, second trimester: Secondary | ICD-10-CM

## 2021-09-16 DIAGNOSIS — Z3480 Encounter for supervision of other normal pregnancy, unspecified trimester: Secondary | ICD-10-CM | POA: Insufficient documentation

## 2021-09-16 DIAGNOSIS — Z3A18 18 weeks gestation of pregnancy: Secondary | ICD-10-CM | POA: Insufficient documentation

## 2021-09-16 NOTE — Progress Notes (Signed)
MFM Note  Bethany Williams was seen for a detailed fetal anatomy scan and consultation due to a prior poor obstetrical history of a preterm delivery at 29+ weeks.  The patient reports that in her last pregnancy, she developed severe abdominal pain after moving furniture and delivered a baby weighing 3 pounds 5 ounces at 29+ weeks a few days later.  It is unclear if the preterm birth was the result of preterm labor or placental abruption.  She denies any problems in her current pregnancy.  She is being treated with Lamictal for an anxiety disorder.  She had a cell free DNA test earlier in her pregnancy which indicated a low risk for trisomy 46, 56, and 13. A female fetus is predicted.   She was informed that the fetal growth and amniotic fluid level were appropriate for her gestational age.   There were no obvious fetal anomalies noted on today's ultrasound exam.  However, today's exam was limited due to the fetal position.  The patient was informed that anomalies may be missed due to technical limitations. If the fetus is in a suboptimal position or maternal habitus is increased, visualization of the fetus in the maternal uterus may be impaired.  A normal-appearing posterior placenta was noted on today's exam.  Her cervical length measured transabdominally was over 3 cm long without any signs of funneling.  As the cause of her preterm birth remains undetermined, we will continue to follow her with monthly growth ultrasounds.    To decrease her risk of placental abruption, she was advised to start taking a daily baby aspirin (81 mg daily) for preeclampsia prophylaxis.  A follow-up growth scan and cervical length was scheduled in 4 weeks.    The patient stated that all of her questions were answered today.  A total of 30 minutes was spent counseling and coordinating the care for this patient.  Greater than 50% of the time was spent in direct face-to-face contact.

## 2021-09-17 ENCOUNTER — Ambulatory Visit (INDEPENDENT_AMBULATORY_CARE_PROVIDER_SITE_OTHER): Payer: Medicaid Other | Admitting: Advanced Practice Midwife

## 2021-09-17 VITALS — BP 120/80 | Wt 115.0 lb

## 2021-09-17 DIAGNOSIS — Z3480 Encounter for supervision of other normal pregnancy, unspecified trimester: Secondary | ICD-10-CM

## 2021-09-17 DIAGNOSIS — Z3A18 18 weeks gestation of pregnancy: Secondary | ICD-10-CM

## 2021-09-17 NOTE — Progress Notes (Signed)
Routine Prenatal Care Visit  Subjective  Bethany Williams is a 26 y.o. G2P0101 at [redacted]w[redacted]d being seen today for ongoing prenatal care.  She is currently monitored for the following issues for this low-risk pregnancy and has Headache disorder; History of abnormal cervical Pap smear; History of preterm delivery; and Supervision of other normal pregnancy, antepartum on their problem list.  ----------------------------------------------------------------------------------- Patient reports round ligament pain.  Bethany Williams is now in [place]- not [place]. Contractions: Not present. Vag. Bleeding: None.  Movement: Present. Leaking Fluid denies.  ----------------------------------------------------------------------------------- The following portions of the patient's history were reviewed and updated as appropriate: allergies, current medications, past family history, past medical history, past social history, past surgical history and problem list. Problem list updated.  Objective  Blood pressure 120/80, weight 115 lb (52.2 kg), last menstrual period 05/11/2021. Pregravid weight 108 lb (49 kg) Total Weight Gain 7 lb (3.175 kg) Urinalysis: Urine Protein    Urine Glucose    Fetal Status: Fetal Heart Rate (bpm): 154 Fundal Height: 19 cm Movement: Present      Anatomy scan yesterday with MFM: incomplete for palate, aortic and ductal arches, upper extremities, placenta posterior, growth 81%. She will have q month growth scans with MFM due to history of 29 week delivery.  General:  Alert, oriented and cooperative. Patient is in no acute distress.  Skin: Skin is warm and dry. No rash noted.   Cardiovascular: Normal heart rate noted  Respiratory: Normal respiratory effort, no problems with respiration noted  Abdomen: Soft, gravid, appropriate for gestational age. Pain/Pressure: Absent     Pelvic:  Cervical exam deferred        Extremities: Normal range of motion.  Edema: None  Mental Status: Normal mood and  affect. Normal behavior. Normal judgment and thought content.   Assessment   26 y.o. G2P0101 at [redacted]w[redacted]d by  02/15/2022, by Last Menstrual Period presenting for routine prenatal visit  Plan   SECOND Problems (from 06/10/21 to present)    Problem Noted Resolved   Supervision of other normal pregnancy, antepartum 06/10/2021 by Loran Senters, CMA No   Overview Addendum 07/01/2021  5:08 PM by Tresea Mall, CNM     Nursing Staff Provider  Office Location  [facility] Dating    Language  English Anatomy US    Flu Vaccine   Genetic Screen  NIPS:   TDaP vaccine    Hgb A1C or  GTT Early : NA Third trimester :   Covid    LAB RESULTS   Rhogam   Blood Type     Feeding Plan Breast Antibody    Contraception  Rubella    Circumcision  RPR     Pediatrician   HBsAg     Support Person [facility] HIV    Prenatal Classes  Varicella     GBS  (For PCN allergy, check sensitivities)   BTL Consent     VBAC Consent NA Pap  negative 2022    Hgb Electro    Pelvis Tested 3#5oz 29w GA CF      SMA                   Preterm labor symptoms and general obstetric precautions including but not limited to vaginal bleeding, contractions, leaking of fluid and fetal movement were reviewed in detail with the patient. Please refer to After Visit Summary for other counseling recommendations.   Return in about 4 weeks (around 10/15/2021) for rob.  Tresea Mall, CNM 09/17/2021 3:24 PM

## 2021-09-23 ENCOUNTER — Other Ambulatory Visit: Payer: Self-pay

## 2021-09-27 ENCOUNTER — Other Ambulatory Visit: Payer: Self-pay | Admitting: Advanced Practice Midwife

## 2021-09-27 ENCOUNTER — Encounter: Payer: Self-pay | Admitting: Advanced Practice Midwife

## 2021-09-27 DIAGNOSIS — O26892 Other specified pregnancy related conditions, second trimester: Secondary | ICD-10-CM

## 2021-09-27 MED ORDER — BUTALBITAL-APAP-CAFFEINE 50-325-40 MG PO CAPS: 1.0000 | ORAL_CAPSULE | Freq: Four times a day (QID) | ORAL | 3 refills | Status: DC | PRN

## 2021-09-27 NOTE — Progress Notes (Signed)
Rx Fioricet sent- patient having daily headaches

## 2021-10-12 ENCOUNTER — Other Ambulatory Visit: Payer: Self-pay

## 2021-10-12 DIAGNOSIS — Z8759 Personal history of other complications of pregnancy, childbirth and the puerperium: Secondary | ICD-10-CM

## 2021-10-12 DIAGNOSIS — Z8751 Personal history of pre-term labor: Secondary | ICD-10-CM

## 2021-10-14 ENCOUNTER — Encounter: Payer: Self-pay | Admitting: Obstetrics & Gynecology

## 2021-10-14 ENCOUNTER — Ambulatory Visit: Payer: Medicaid Other | Attending: Maternal & Fetal Medicine

## 2021-10-14 ENCOUNTER — Encounter: Payer: Medicaid Other | Admitting: Obstetrics & Gynecology

## 2021-10-14 ENCOUNTER — Ambulatory Visit (INDEPENDENT_AMBULATORY_CARE_PROVIDER_SITE_OTHER): Payer: Medicaid Other | Admitting: Obstetrics & Gynecology

## 2021-10-14 ENCOUNTER — Other Ambulatory Visit: Payer: Self-pay

## 2021-10-14 ENCOUNTER — Other Ambulatory Visit: Payer: Self-pay | Admitting: Advanced Practice Midwife

## 2021-10-14 VITALS — BP 104/70 | Wt 118.4 lb

## 2021-10-14 VITALS — BP 121/78 | HR 85 | Temp 97.9°F | Ht 60.0 in | Wt 117.5 lb

## 2021-10-14 DIAGNOSIS — Z8759 Personal history of other complications of pregnancy, childbirth and the puerperium: Secondary | ICD-10-CM | POA: Diagnosis not present

## 2021-10-14 DIAGNOSIS — Z8751 Personal history of pre-term labor: Secondary | ICD-10-CM | POA: Diagnosis not present

## 2021-10-14 DIAGNOSIS — Z3A22 22 weeks gestation of pregnancy: Secondary | ICD-10-CM

## 2021-10-14 DIAGNOSIS — O358XX0 Maternal care for other (suspected) fetal abnormality and damage, not applicable or unspecified: Secondary | ICD-10-CM | POA: Diagnosis not present

## 2021-10-14 DIAGNOSIS — Z3480 Encounter for supervision of other normal pregnancy, unspecified trimester: Secondary | ICD-10-CM

## 2021-10-14 DIAGNOSIS — O09212 Supervision of pregnancy with history of pre-term labor, second trimester: Secondary | ICD-10-CM

## 2021-10-14 NOTE — Progress Notes (Signed)
Subjective:    Bethany Williams is a 25 y.o. female being seen today for her obstetrical visit. She is at [redacted]w[redacted]d gestation. Patient reports no bleeding, no contractions, no cramping, and no leaking. Fetal movement: normal.  Menstrual History: OB History     Gravida  2   Para  1   Term  0   Preterm  1   AB  0   Living  1      SAB  0   IAB  0   Ectopic  0   Multiple  0   Live Births  1            Patient's last menstrual period was 05/11/2021 (approximate).     The following portions of the patient's history were reviewed and updated as appropriate: allergies, current medications, past family history, past medical history, past social history, past surgical history, and problem list. Review of Systems A comprehensive review of systems was negative.   Objective:    BP 104/70   Wt 118 lb 6.4 oz (53.7 kg)   LMP 05/11/2021 (Approximate)   BMI 23.12 kg/m  FHT: 148 BPM  Uterine Size: 20 cm     Assessment:    Pregnancy 22 and 2/7 weeks   Plan:    Signs and symptoms of preterm labor: discussed. Follow up in 3 weeks.   26 y/o g2 P0101 s/p prterm delivery after moving furniture- pt had a suspected placental abruption.  She is currently [redacted] weeks EGA, and is without complaints.  Scheduled for f/u US with MFM

## 2021-10-20 ENCOUNTER — Telehealth: Payer: Self-pay

## 2021-10-20 NOTE — Telephone Encounter (Signed)
Msg left on triage line from Danie Chandler, [facility] Care Management. She just wants to let us know pt is still engaged in care management and if she can be in any help with anything to contact her.

## 2021-11-04 ENCOUNTER — Encounter: Payer: Self-pay | Admitting: Advanced Practice Midwife

## 2021-11-04 ENCOUNTER — Ambulatory Visit (INDEPENDENT_AMBULATORY_CARE_PROVIDER_SITE_OTHER): Payer: Medicaid Other | Admitting: Advanced Practice Midwife

## 2021-11-04 VITALS — BP 120/80 | Wt 120.0 lb

## 2021-11-04 DIAGNOSIS — Z3480 Encounter for supervision of other normal pregnancy, unspecified trimester: Secondary | ICD-10-CM

## 2021-11-04 DIAGNOSIS — Z3A25 25 weeks gestation of pregnancy: Secondary | ICD-10-CM

## 2021-11-04 DIAGNOSIS — Z131 Encounter for screening for diabetes mellitus: Secondary | ICD-10-CM

## 2021-11-04 DIAGNOSIS — Z130 Encounter for screening for diseases of the blood and blood-forming organs and certain disorders involving the immune mechanism: Secondary | ICD-10-CM

## 2021-11-04 DIAGNOSIS — Z113 Encounter for screening for infections with a predominantly sexual mode of transmission: Secondary | ICD-10-CM

## 2021-11-04 DIAGNOSIS — O26892 Other specified pregnancy related conditions, second trimester: Secondary | ICD-10-CM

## 2021-11-04 DIAGNOSIS — Z369 Encounter for antenatal screening, unspecified: Secondary | ICD-10-CM

## 2021-11-04 DIAGNOSIS — R519 Headache, unspecified: Secondary | ICD-10-CM

## 2021-11-04 LAB — POCT URINALYSIS DIPSTICK OB
Glucose, UA: NEGATIVE
POC,PROTEIN,UA: NEGATIVE

## 2021-11-04 NOTE — Progress Notes (Signed)
Routine Prenatal Care Visit  Subjective  Bethany Williams is a 26 y.o. G2P0101 at [redacted]w[redacted]d being seen today for ongoing prenatal care.  She is currently monitored for the following issues for this low-risk pregnancy and has Headache disorder; History of abnormal cervical Pap smear; History of preterm delivery; and Supervision of other normal pregnancy, antepartum on their problem list.  ----------------------------------------------------------------------------------- Patient reports her headache has improved and hasn't needed fioricet recently- only occasional tylenol.   Contractions: Not present. Vag. Bleeding: None.  Movement: Present. Leaking Fluid denies.  ----------------------------------------------------------------------------------- The following portions of the patient's history were reviewed and updated as appropriate: allergies, current medications, past family history, past medical history, past social history, past surgical history and problem list. Problem list updated.  Objective  Blood pressure 120/80, weight 120 lb (54.4 kg), last menstrual period 05/11/2021. Pregravid weight 108 lb (49 kg) Total Weight Gain 12 lb (5.443 kg) Urinalysis: Urine Protein Negative  Urine Glucose Negative  Fetal Status: Fetal Heart Rate (bpm): 156 Fundal Height: 25 cm Movement: Present     General:  Alert, oriented and cooperative. Patient is in no acute distress.  Skin: Skin is warm and dry. No rash noted.   Cardiovascular: Normal heart rate noted  Respiratory: Normal respiratory effort, no problems with respiration noted  Abdomen: Soft, gravid, appropriate for gestational age. Pain/Pressure: Absent     Pelvic:  Cervical exam deferred        Extremities: Normal range of motion.  Edema: None  Mental Status: Normal mood and affect. Normal behavior. Normal judgment and thought content.   Assessment   26 y.o. G2P0101 at [redacted]w[redacted]d by  02/15/2022, by Last Menstrual Period presenting for routine prenatal  visit  Plan   SECOND Problems (from 06/10/21 to present)    Problem Noted Resolved   Supervision of other normal pregnancy, antepartum 06/10/2021 by Loran Senters, CMA No   Overview Addendum 07/01/2021  5:08 PM by Tresea Mall, CNM     Nursing Staff Provider  Office Location  [facility] Dating    Language  English Anatomy US    Flu Vaccine   Genetic Screen  NIPS:   TDaP vaccine    Hgb A1C or  GTT Early : NA Third trimester :   Covid    LAB RESULTS   Rhogam   Blood Type     Feeding Plan Breast Antibody    Contraception  Rubella    Circumcision  RPR     Pediatrician   HBsAg     Support Person [facility] HIV    Prenatal Classes  Varicella     GBS  (For PCN allergy, check sensitivities)   BTL Consent     VBAC Consent NA Pap  negative 2022    Hgb Electro    Pelvis Tested 3#5oz 29w GA CF      SMA                   Preterm labor symptoms and general obstetric precautions including but not limited to vaginal bleeding, contractions, leaking of fluid and fetal movement were reviewed in detail with the patient. Please refer to After Visit Summary for other counseling recommendations.   Return in about 3 weeks (around 11/25/2021) for 28 wk labs and rob.  Tresea Mall, CNM 11/04/2021 1:27 PM

## 2021-11-06 NOTE — Progress Notes (Signed)
1 

## 2021-11-09 ENCOUNTER — Other Ambulatory Visit: Payer: Self-pay

## 2021-11-09 DIAGNOSIS — Z8759 Personal history of other complications of pregnancy, childbirth and the puerperium: Secondary | ICD-10-CM

## 2021-11-11 ENCOUNTER — Other Ambulatory Visit: Payer: Self-pay

## 2021-11-11 ENCOUNTER — Ambulatory Visit: Payer: Medicaid Other | Attending: Obstetrics

## 2021-11-11 VITALS — BP 117/84 | HR 96 | Temp 98.0°F | Ht 60.0 in | Wt 121.5 lb

## 2021-11-11 DIAGNOSIS — Z3A26 26 weeks gestation of pregnancy: Secondary | ICD-10-CM | POA: Diagnosis not present

## 2021-11-11 DIAGNOSIS — O359XX0 Maternal care for (suspected) fetal abnormality and damage, unspecified, not applicable or unspecified: Secondary | ICD-10-CM | POA: Diagnosis not present

## 2021-11-11 DIAGNOSIS — O09212 Supervision of pregnancy with history of pre-term labor, second trimester: Secondary | ICD-10-CM

## 2021-11-11 DIAGNOSIS — O09292 Supervision of pregnancy with other poor reproductive or obstetric history, second trimester: Secondary | ICD-10-CM | POA: Insufficient documentation

## 2021-11-11 DIAGNOSIS — Z3480 Encounter for supervision of other normal pregnancy, unspecified trimester: Secondary | ICD-10-CM

## 2021-11-11 DIAGNOSIS — O352XX0 Maternal care for (suspected) hereditary disease in fetus, not applicable or unspecified: Secondary | ICD-10-CM | POA: Diagnosis not present

## 2021-11-11 DIAGNOSIS — Z8759 Personal history of other complications of pregnancy, childbirth and the puerperium: Secondary | ICD-10-CM

## 2021-11-24 ENCOUNTER — Ambulatory Visit (INDEPENDENT_AMBULATORY_CARE_PROVIDER_SITE_OTHER): Payer: Medicaid Other | Admitting: Obstetrics & Gynecology

## 2021-11-24 ENCOUNTER — Other Ambulatory Visit: Payer: Medicaid Other

## 2021-11-24 VITALS — BP 101/52 | Wt 127.2 lb

## 2021-11-24 DIAGNOSIS — Z3480 Encounter for supervision of other normal pregnancy, unspecified trimester: Secondary | ICD-10-CM | POA: Diagnosis not present

## 2021-11-24 DIAGNOSIS — Z131 Encounter for screening for diabetes mellitus: Secondary | ICD-10-CM | POA: Diagnosis not present

## 2021-11-24 DIAGNOSIS — Z130 Encounter for screening for diseases of the blood and blood-forming organs and certain disorders involving the immune mechanism: Secondary | ICD-10-CM | POA: Diagnosis not present

## 2021-11-24 DIAGNOSIS — Z369 Encounter for antenatal screening, unspecified: Secondary | ICD-10-CM

## 2021-11-24 DIAGNOSIS — Z113 Encounter for screening for infections with a predominantly sexual mode of transmission: Secondary | ICD-10-CM | POA: Diagnosis not present

## 2021-11-24 DIAGNOSIS — Z23 Encounter for immunization: Secondary | ICD-10-CM | POA: Diagnosis not present

## 2021-11-25 LAB — 28 WEEK RH+PANEL
Basophils Absolute: 0 10*3/uL (ref 0.0–0.2)
Basos: 0 %
EOS (ABSOLUTE): 0.1 10*3/uL (ref 0.0–0.4)
Eos: 1 %
Gestational Diabetes Screen: 209 mg/dL — ABNORMAL HIGH (ref 70–139)
HIV Screen 4th Generation wRfx: NONREACTIVE
Hematocrit: 32.4 % — ABNORMAL LOW (ref 34.0–46.6)
Hemoglobin: 11.1 g/dL (ref 11.1–15.9)
Immature Grans (Abs): 0.1 10*3/uL (ref 0.0–0.1)
Immature Granulocytes: 1 %
Lymphocytes Absolute: 1.9 10*3/uL (ref 0.7–3.1)
Lymphs: 18 %
MCH: 31.6 pg (ref 26.6–33.0)
MCHC: 34.3 g/dL (ref 31.5–35.7)
MCV: 92 fL (ref 79–97)
Monocytes Absolute: 0.5 10*3/uL (ref 0.1–0.9)
Monocytes: 5 %
Neutrophils Absolute: 8.2 10*3/uL — ABNORMAL HIGH (ref 1.4–7.0)
Neutrophils: 75 %
Platelets: 255 10*3/uL (ref 150–450)
RBC: 3.51 x10E6/uL — ABNORMAL LOW (ref 3.77–5.28)
RDW: 12.1 % (ref 11.7–15.4)
RPR Ser Ql: NONREACTIVE
WBC: 11 10*3/uL — ABNORMAL HIGH (ref 3.4–10.8)

## 2021-11-27 NOTE — Progress Notes (Signed)
  Subjective:    Bethany Williams is being seen today for her first obstetrical visit.  This is a planned pregnancy. She is at [redacted]w[redacted]d gestation. Her obstetrical history is significant for  preterm delivery low risk preg. .   Patient reports headache, no bleeding, no cramping, and no leaking.  Review of Systems:   Review of Systems  Objective:     BP (!) 101/52   Wt 127 lb 3.2 oz (57.7 kg)   LMP 05/11/2021 (Approximate)   BMI 24.84 kg/m  Physical Exam  Exam    Assessment:    Pregnancy: G2P0101 Patient Active Problem List   Diagnosis Date Noted   Supervision of other normal pregnancy, antepartum 06/10/2021   History of abnormal cervical Pap smear 01/29/2021   History of preterm delivery 01/29/2021   Headache disorder 08/17/2018       Plan:     Tdap vaccine greater or equal to 25 yo IM   Rosario Adie 11/27/2021

## 2021-11-30 ENCOUNTER — Telehealth: Payer: Self-pay

## 2021-11-30 NOTE — Telephone Encounter (Signed)
Pt needs a 3 hr gtt scheduled. Just spoke with her. Can someone please call and help get her scheduled on the LAB schedule soon?

## 2021-12-02 ENCOUNTER — Other Ambulatory Visit: Payer: Medicaid Other

## 2021-12-02 ENCOUNTER — Other Ambulatory Visit: Payer: Self-pay | Admitting: Obstetrics

## 2021-12-02 ENCOUNTER — Other Ambulatory Visit: Payer: Self-pay | Admitting: Advanced Practice Midwife

## 2021-12-02 DIAGNOSIS — R7309 Other abnormal glucose: Secondary | ICD-10-CM

## 2021-12-02 DIAGNOSIS — O24419 Gestational diabetes mellitus in pregnancy, unspecified control: Secondary | ICD-10-CM | POA: Insufficient documentation

## 2021-12-02 DIAGNOSIS — Z3480 Encounter for supervision of other normal pregnancy, unspecified trimester: Secondary | ICD-10-CM

## 2021-12-02 DIAGNOSIS — O24410 Gestational diabetes mellitus in pregnancy, diet controlled: Secondary | ICD-10-CM

## 2021-12-06 DIAGNOSIS — F4312 Post-traumatic stress disorder, chronic: Secondary | ICD-10-CM | POA: Diagnosis not present

## 2021-12-07 ENCOUNTER — Other Ambulatory Visit: Payer: Self-pay

## 2021-12-07 DIAGNOSIS — O24410 Gestational diabetes mellitus in pregnancy, diet controlled: Secondary | ICD-10-CM

## 2021-12-07 DIAGNOSIS — Z8759 Personal history of other complications of pregnancy, childbirth and the puerperium: Secondary | ICD-10-CM

## 2021-12-07 DIAGNOSIS — Z8751 Personal history of pre-term labor: Secondary | ICD-10-CM

## 2021-12-08 ENCOUNTER — Telehealth: Payer: Self-pay | Admitting: Licensed Practical Nurse

## 2021-12-08 NOTE — Telephone Encounter (Signed)
Pt's 1 hour 209, was scheduled for 3 hour glucose test, dicussed at [facility] that 3 hour not necessary as 209 is diagnotic for GDM. Call to [facility] to discuss above, reports Opal Sidles has already talked to her, she has been checking her blood sugars for a week, they have been WNL, she follows a health diet.  She did attempt the 3 hour test but vomited 1.5 hours in to test. Discussed 3 hour test not needed. Lifestyles education ordered but not yet scheduled.  Has ROB tomorrow  with Hanna, CNM  Mosetta Pigeon, [facility]  @TODAY @  7:24 PM

## 2021-12-09 ENCOUNTER — Other Ambulatory Visit: Payer: Self-pay

## 2021-12-09 ENCOUNTER — Ambulatory Visit: Payer: Medicaid Other | Attending: Obstetrics

## 2021-12-09 ENCOUNTER — Ambulatory Visit (HOSPITAL_BASED_OUTPATIENT_CLINIC_OR_DEPARTMENT_OTHER): Payer: Medicaid Other | Admitting: Obstetrics

## 2021-12-09 ENCOUNTER — Ambulatory Visit (INDEPENDENT_AMBULATORY_CARE_PROVIDER_SITE_OTHER): Payer: Medicaid Other | Admitting: Certified Nurse Midwife

## 2021-12-09 VITALS — BP 109/71 | HR 74 | Wt 131.2 lb

## 2021-12-09 VITALS — BP 122/78 | HR 83 | Temp 98.6°F | Ht 60.0 in | Wt 131.0 lb

## 2021-12-09 DIAGNOSIS — O24419 Gestational diabetes mellitus in pregnancy, unspecified control: Secondary | ICD-10-CM | POA: Insufficient documentation

## 2021-12-09 DIAGNOSIS — Z8751 Personal history of pre-term labor: Secondary | ICD-10-CM | POA: Diagnosis not present

## 2021-12-09 DIAGNOSIS — O09213 Supervision of pregnancy with history of pre-term labor, third trimester: Secondary | ICD-10-CM

## 2021-12-09 DIAGNOSIS — Z3480 Encounter for supervision of other normal pregnancy, unspecified trimester: Secondary | ICD-10-CM

## 2021-12-09 DIAGNOSIS — Z8759 Personal history of other complications of pregnancy, childbirth and the puerperium: Secondary | ICD-10-CM | POA: Diagnosis not present

## 2021-12-09 DIAGNOSIS — Z3A30 30 weeks gestation of pregnancy: Secondary | ICD-10-CM | POA: Insufficient documentation

## 2021-12-09 DIAGNOSIS — O09293 Supervision of pregnancy with other poor reproductive or obstetric history, third trimester: Secondary | ICD-10-CM

## 2021-12-09 DIAGNOSIS — O24410 Gestational diabetes mellitus in pregnancy, diet controlled: Secondary | ICD-10-CM

## 2021-12-09 DIAGNOSIS — Z3483 Encounter for supervision of other normal pregnancy, third trimester: Secondary | ICD-10-CM

## 2021-12-09 LAB — POCT URINALYSIS DIPSTICK OB
Bilirubin, UA: NEGATIVE
Blood, UA: NEGATIVE
Glucose, UA: NEGATIVE
Ketones, UA: NEGATIVE
Leukocytes, UA: NEGATIVE
Nitrite, UA: NEGATIVE
Spec Grav, UA: 1.01 (ref 1.010–1.025)
Urobilinogen, UA: 0.2 E.U./dL
pH, UA: 7 (ref 5.0–8.0)

## 2021-12-09 NOTE — Progress Notes (Signed)
MFM Note  Bethany Williams was seen for a follow up exam due to a prior preterm birth at 18+ weeks.  She was recently diagnosed with diet-controlled gestational diabetes.  She reports that her fingerstick values have mostly been within normal limits.  She was informed that the fetal growth and amniotic fluid level appears appropriate for her gestational age.  The following were discussed during today's consultation:  Gestational diabetes  The implications and management of diabetes in pregnancy was discussed in detail with the patient.    She was advised to continue to monitor her fingersticks 4 times daily (fasting and 2 hours after each meal).    She was advised that our goals for her fingerstick values are fasting values of 90-95 or less and two-hour postprandial values of 120 or less.    Should the majority (greater than 50%) of her fingerstick results be above these values, she may have to be started on insulin or metformin to help her achieve better glycemic control.   The patient was advised that getting her fingerstick values as close to these goals as possible would provide her with the most optimal obstetrical outcome.  The increased risk of polyhydramnios, fetal macrosomia, and preeclampsia associated with diabetes was also discussed.    Weekly fetal testing starting at 32 weeks is recommended should she require insulin or metformin for treatment of gestational diabetes.  The patient was advised that delivery for well-controlled diabetes in pregnancy is usually recommended at around 39 weeks.    Delivery at 37 weeks may be considered should her glycemic control be poor.  A follow-up growth scan was scheduled in 4 weeks.  The patient stated that all of her questions have been answered to her satisfaction.    A total of 20 minutes was spent counseling and coordinating the care for this patient.  Greater than 50% of the time was spent in direct face-to-face contact.

## 2021-12-09 NOTE — Patient Instructions (Signed)
Round Ligament Pain  The round ligaments are a pair of cord-like tissues that help support the uterus. They can become a source of pain during pregnancy as the ligaments soften and stretch as the baby grows. The pain usually begins in the second trimester (13-28 weeks) of pregnancy, and should only last for a few seconds when it occurs. However, the pain can come and go until the baby is delivered. The pain does not cause harm to the baby. Round ligament pain is usually a short, sharp, and pinching pain, but it can also be a dull, lingering, and aching pain. The pain is felt in the lower side of the abdomen or in the groin. It usually starts deep in the groin and moves up to the outside of the hip area. The pain may happen when you: Suddenly change position, such as quickly going from a sitting to standing position. Do physical activity. Cough or sneeze. Follow these instructions at home: Managing pain  When the pain starts, relax. Then, try any of these methods to help with the pain: Sit down. Flex your knees up to your abdomen. Lie on your side with one pillow under your abdomen and another pillow between your legs. Sit in a warm bath for 15-20 minutes or until the pain goes away. General instructions Watch your condition for any changes. Move slowly when you sit down or stand up. Stop or reduce your physical activities if they cause pain. Avoid long walks if they cause pain. Take over-the-counter and prescription medicines only as told by your health care provider. Keep all follow-up visits. This is important. Contact a health care provider if: Your pain does not go away with treatment. You feel pain in your back that you did not have before. Your medicine is not helping. You have a fever or chills. You have nausea or vomiting. You have diarrhea. You have pain when you urinate. Get help right away if: You have pain that is a rhythmic, cramping pain similar to labor pains. Labor  pains are usually 2 minutes apart, last for about 1 minute, and involve a bearing down feeling or pressure in your pelvis. You have vaginal bleeding. These symptoms may represent a serious problem that is an emergency. Do not wait to see if the symptoms will go away. Get medical help right away. Call your local emergency services (911 in the [place] Do not drive yourself to the hospital. Summary Round ligament pain is felt in the lower abdomen or groin. This pain usually begins in the second trimester (13-28 weeks) and should only last for a few seconds when it occurs. You may notice the pain when you suddenly change position, when you cough or sneeze, or during physical activity. Relaxing, flexing your knees to your abdomen, lying on one side, or taking a warm bath may help to get rid of the pain. Contact your health care provider if the pain does not go away. This information is not intended to replace advice given to you by your health care provider. Make sure you discuss any questions you have with your health care provider. Document Revised: 04/29/2020 Document Reviewed: 04/29/2020 Elsevier Patient Education  2023 [employer]  

## 2021-12-09 NOTE — Progress Notes (Signed)
ROB doing well, feeling good movement. Movement  Seen on exam. Discussed blood sugar log ( on her phone) she should me the past few days she had one elevated 120 this am, she had spaghetti for dinner and drank chocolate milk over night. She a 2 elevated 2 hr PP ( state she had juice & grapes) . Encouraged pt to stick to diet as best she can. Discussed possibility of introducing medications should her sugars not be managed well. She verbalizes and agrees. She has MFM appointment today. See note. Follow up 2 wks.   Philip Aspen, CNM

## 2021-12-13 ENCOUNTER — Observation Stay
Admission: EM | Admit: 2021-12-13 | Discharge: 2021-12-13 | Disposition: A | Discharge status: Home / Self Care | Payer: Medicaid Other | Attending: Licensed Practical Nurse | Admitting: Licensed Practical Nurse

## 2021-12-13 ENCOUNTER — Encounter: Payer: Self-pay | Admitting: Obstetrics and Gynecology

## 2021-12-13 ENCOUNTER — Other Ambulatory Visit: Payer: Self-pay

## 2021-12-13 DIAGNOSIS — Z3A30 30 weeks gestation of pregnancy: Secondary | ICD-10-CM | POA: Insufficient documentation

## 2021-12-13 DIAGNOSIS — O99891 Other specified diseases and conditions complicating pregnancy: Secondary | ICD-10-CM | POA: Diagnosis not present

## 2021-12-13 DIAGNOSIS — O24419 Gestational diabetes mellitus in pregnancy, unspecified control: Secondary | ICD-10-CM

## 2021-12-13 DIAGNOSIS — Z3480 Encounter for supervision of other normal pregnancy, unspecified trimester: Secondary | ICD-10-CM

## 2021-12-13 DIAGNOSIS — Z91040 Latex allergy status: Secondary | ICD-10-CM | POA: Diagnosis not present

## 2021-12-13 DIAGNOSIS — Z87891 Personal history of nicotine dependence: Secondary | ICD-10-CM | POA: Diagnosis not present

## 2021-12-13 DIAGNOSIS — N760 Acute vaginitis: Secondary | ICD-10-CM | POA: Diagnosis not present

## 2021-12-13 DIAGNOSIS — O23593 Infection of other part of genital tract in pregnancy, third trimester: Principal | ICD-10-CM | POA: Insufficient documentation

## 2021-12-13 DIAGNOSIS — N898 Other specified noninflammatory disorders of vagina: Secondary | ICD-10-CM | POA: Diagnosis present

## 2021-12-13 DIAGNOSIS — O26899 Other specified pregnancy related conditions, unspecified trimester: Secondary | ICD-10-CM | POA: Diagnosis present

## 2021-12-13 LAB — RUPTURE OF MEMBRANE (ROM)PLUS: Rom Plus: NEGATIVE

## 2021-12-13 LAB — CHLAMYDIA/NGC RT PCR (ARMC ONLY)
Chlamydia Tr: NOT DETECTED
N gonorrhoeae: NOT DETECTED

## 2021-12-13 LAB — WET PREP, GENITAL
Sperm: NONE SEEN
Trich, Wet Prep: NONE SEEN
WBC, Wet Prep HPF POC: >=10 — AB (ref <10)
Yeast Wet Prep HPF POC: NONE SEEN

## 2021-12-13 MED ORDER — METRONIDAZOLE 0.75 % VA GEL: 1.0000 | Freq: Every day | VAGINAL | 1 refills | Status: DC

## 2021-12-13 NOTE — OB Triage Note (Signed)
Pt is a 26yo G2P0101, 30w 6d. She arrived to the unit with complaints of  leaking of fluid. Pt stated that she woke up this morning at 0530 with "wet shorts,"  she states that she has changed shorts 3x today noting that it is clear watery liquid with no odor. She denies vaginal bleeding, reports positive fetal movement, and reports feeling no contractions. VS stable, monitors applied and assessing. CNM aware and en route.   Initial FHT 145 at 1820.

## 2021-12-13 NOTE — Discharge Summary (Signed)
Physician Final Progress Note  Patient ID: Bethany Williams MRN: [id number] DOB/AGE: October 04, 1995 26 y.o.  Admit date: 12/13/2021 Admitting provider: Suzy Bouchard, MD Discharge date: 12/13/2021   Admission Diagnoses:  1) intrauterine pregnancy at [redacted]w[redacted]d  2) leaking clear fluid   Discharge Diagnoses:  Principal Problem:   Vaginal discharge during pregnancy Active Problems:   Bacterial vaginosis   History of Present Illness: The patient is a 26 y.o. female G2P0101 at [redacted]w[redacted]d who presents for evaluation for feeling wet.  When Bethany Williams woke up this morning her underwear were wet, but her clothes were not soak, nor was she in a puddle.  By evening she had needed to change her underwear a third time, because they were wet.  Her mother suggested that she call her provider, the answering service instructed her to come in. She di not wear a pad in, she did not experience any large gushes of fluid, just that her underwear gets wet. She has not noticed an odor, denies any odor or vaginal irritation. She last had IC 1 day ago.  She denies contractions. She endorses +FM.   Bethany Williams had a recent US that showed normal AFI (9.85) and growth (1708 grams, 68%).  She recently has been diagnosed with GDM and has been checking her blood sugars, they have been WNL for the most part.   Past Medical History:  Diagnosis Date   Anxiety    Depression    Family history of ovarian cancer    12/22 cancer genetic testing letter sent   PTSD (post-traumatic stress disorder)     Past Surgical History:  Procedure Laterality Date   WISDOM TOOTH EXTRACTION      No current facility-administered medications on file prior to encounter.   Current Outpatient Medications on File Prior to Encounter  Medication Sig Dispense Refill   aspirin EC (BAYER ASPIRIN EC LOW DOSE) 81 MG tablet      FLUoxetine (PROZAC) 10 MG capsule Take 20 mg by mouth every morning.     lamoTRIgine (LAMICTAL) 100 MG tablet Take 100 mg by mouth  daily.     Prenatal Vit-Fe Fumarate-FA (MULTIVITAMIN-PRENATAL) 27-0.8 MG TABS tablet Take 1 tablet by mouth daily at 12 noon.     Butalbital-APAP-Caffeine 50-325-40 MG capsule Take 1-2 capsules by mouth every 6 (Williams) hours as needed for headache. (Patient not taking: Reported on 11/11/2021) 30 capsule 3   Cholecalciferol (VITAMIN D3) 250 MCG (10000 UT) TABS Take 1 tablet by mouth daily. (Patient not taking: Reported on 11/11/2021)     ondansetron (ZOFRAN-ODT) 4 MG disintegrating tablet TAKE 1 TABLET (4 MG TOTAL) BY MOUTH EVERY 6 HOURS AS NEEDED FOR UP TO 5 DAYS FOR NAUSEA (Patient not taking: Reported on 12/13/2021) 16 tablet 0    Allergies  Allergen Reactions   Sulfa Antibiotics Anaphylaxis   Dilaudid [Hydromorphone]    Latex    Ketorolac Rash    Social History   Socioeconomic History   Marital status: Married    Spouse name: Verdon Cummins   Number of children: 1   Years of education: 13.5   Highest education level: Not on file  Occupational History   Not on file  Tobacco Use   Smoking status: Former    Types: E-cigarettes    Quit date: 01/28/2017    Years since quitting: 4.8   Smokeless tobacco: Never  Vaping Use   Vaping Use: Every day  Substance and Sexual Activity   Alcohol use: Not Currently   Drug use: Never  Sexual activity: Yes    Birth control/protection: None  Other Topics Concern   Not on file  Social History Narrative   Not on file   Social Determinants of Health   Financial Resource Strain: Low Risk  (06/10/2021)   Overall Financial Resource Strain (CARDIA)    Difficulty of Paying Living Expenses: Not hard at all  Food Insecurity: No Food Insecurity (06/10/2021)   Hunger Vital Sign    Worried About Running Out of Food in the Last Year: Never true    Ran Out of Food in the Last Year: Never true  Transportation Needs: No Transportation Needs (06/10/2021)   PRAPARE - Administrator, Civil Service (Medical): No    Lack of Transportation (Non-Medical): No   Physical Activity: Sufficiently Active (06/10/2021)   Exercise Vital Sign    Days of Exercise per Week: 5 days    Minutes of Exercise per Session: 30 min  Stress: No Stress Concern Present (06/10/2021)   [employer] of Occupational Health - Occupational Stress Questionnaire    Feeling of Stress : Not at all  Social Connections: Moderately Isolated (06/10/2021)   Social Connection and Isolation Panel [NHANES]    Frequency of Communication with Friends and Family: More than three times a week    Frequency of Social Gatherings with Friends and Family: More than three times a week    Attends Religious Services: Never    Database administrator or Organizations: No    Attends Banker Meetings: Never    Marital Status: Living with partner  Intimate Partner Violence: Not At Risk (06/10/2021)   Humiliation, Afraid, Rape, and Kick questionnaire    Fear of Current or Ex-Partner: No    Emotionally Abused: No    Physically Abused: No    Sexually Abused: No    Family History  Problem Relation Age of Onset   Ovarian cancer Mother 69   Heart disease Mother    Stroke Mother    Hypertension Mother    Leukemia Father    Diabetes Sister    Hypertension Sister    Hypertension Maternal Grandmother    Diabetes Maternal Grandfather    Hypertension Maternal Grandfather    Lung cancer Paternal Grandfather      Review of Systems  Constitutional: Negative.   Respiratory: Negative.    Cardiovascular: Negative.   Gastrointestinal: Negative.   Genitourinary:        Needing to change underwear three times d/t being wet   Musculoskeletal: Negative.   Neurological: Negative.   Psychiatric/Behavioral: Negative.       Physical Exam: BP 118/68   Pulse 80   Temp 98.2 F (36.8 C)   Resp 14   Ht 5' (1.524 m)   Wt 59.8 kg   LMP 05/11/2021 (Approximate)   BMI 25.74 kg/m   Physical Exam Constitutional:      Appearance: Normal appearance.  Genitourinary:     Vulva normal.      Genitourinary Comments: SSE cervix pink, no lesions, visually closed, thin bubbly white discharge present   Pulmonary:     Effort: Pulmonary effort is normal.  Abdominal:     Tenderness: There is no abdominal tenderness.     Comments: Gravid   Musculoskeletal:     Cervical back: Normal range of motion and neck supple.  Neurological:     Mental Status: She is alert.    EFM: baseline 140, moderate variability, pos accel, neg decel  TOCO: irritability  ROM plus negative Microscopic wet-mount exam shows clue cells, white blood cells.  Ferning negative   Consults: None  Significant Findings/ Diagnostic Studies: negative pooling, ferning, ROM plus negative. Positive clue and WBC on wet prep.   Procedures: RNST   Hospital Course: The patient was admitted to Labor and Delivery Triage for observation. She had a RNST.  ROM plus and ferning negative. Clue cells present on wet prep. Will treat for BV. Pt prefers metrogel.   Discharge Condition: stable  Disposition: Discharge disposition: 01-Home or Self Care      Keep next ROB. Diet: Regular diet  Discharge Activity: Activity as tolerated   Allergies as of 12/13/2021       Reactions   Sulfa Antibiotics Anaphylaxis   Dilaudid [hydromorphone]    Latex    Ketorolac Rash        Medication List     STOP taking these medications    Butalbital-APAP-Caffeine 50-325-40 MG capsule   ondansetron 4 MG disintegrating tablet Commonly known as: ZOFRAN-ODT       TAKE these medications    Bayer Aspirin EC Low Dose 81 MG tablet Generic drug: aspirin EC   FLUoxetine 10 MG capsule Commonly known as: PROZAC Take 20 mg by mouth every morning.   lamoTRIgine 100 MG tablet Commonly known as: LAMICTAL Take 100 mg by mouth daily.   metroNIDAZOLE 0.75 % vaginal gel Commonly known as: METROGEL Place 1 Applicatorful vaginally at bedtime. Apply one applicatorful to vagina at bedtime for 5 days   multivitamin-prenatal 27-0.8 MG  Tabs tablet Take 1 tablet by mouth daily at 12 noon.   Vitamin D3 250 MCG (10000 UT) Tabs Take 1 tablet by mouth daily.         Total time spent taking care of this patient: 20 minutes  Signed: [place], CNM  12/13/2021, 7:47 PM

## 2021-12-20 ENCOUNTER — Encounter: Payer: Medicaid Other | Attending: Advanced Practice Midwife | Admitting: *Deleted

## 2021-12-20 ENCOUNTER — Encounter: Payer: Self-pay | Admitting: *Deleted

## 2021-12-20 VITALS — BP 110/60 | Ht 60.0 in | Wt 131.8 lb

## 2021-12-20 DIAGNOSIS — O24410 Gestational diabetes mellitus in pregnancy, diet controlled: Secondary | ICD-10-CM | POA: Insufficient documentation

## 2021-12-20 DIAGNOSIS — Z713 Dietary counseling and surveillance: Secondary | ICD-10-CM | POA: Insufficient documentation

## 2021-12-20 NOTE — Patient Instructions (Signed)
Read booklet on Gestational Diabetes Follow Gestational Meal Planning Guidelines Don't skip meals - eat 1 protein and 1 carbohydrate serving Avoid sugar sweetened drinks (juice) Increase water intake 6-8 servings/day Complete a 3 Day Food Record and bring to next appointment Check blood sugars 4 x day - before breakfast and 2 hrs after every meal and record  Bring blood sugar log to all appointments Purchase urine ketone strips if instructed by MD and check urine ketones every am:  If + increase bedtime snack to 1 protein and 2 carbohydrate servings Walk 20-30 minutes at least 5 x week if permitted by MD

## 2021-12-20 NOTE — Progress Notes (Signed)
Diabetes Self-Management Education  Visit Type: First/Initial  Appt. Start Time: 0830 Appt. End Time: 0940  12/20/2021  Ms. Bethany Williams, identified by name and date of birth, is a 26 y.o. female with a diagnosis of Diabetes: Gestational Diabetes.   ASSESSMENT  Blood pressure 110/60, height 5' (1.524 m), weight 131 lb 12.8 oz (59.8 kg), last menstrual period 05/11/2021, estimated date of delivery 02/15/2022 Body mass index is 25.74 kg/m.   Diabetes Self-Management Education - 12/20/21 0950       Visit Information   Visit Type First/Initial      Initial Visit   Diabetes Type Gestational Diabetes    Date Diagnosed 1 month    Are you currently following a meal plan? Yes    What type of meal plan do you follow? ""cut down on milk and juice, sugar free drinks"    Are you taking your medications as prescribed? Yes      Health Coping   How would you rate your overall health? Good      Psychosocial Assessment   Patient Belief/Attitude about Diabetes Motivated to manage diabetes    What is the hardest part about your diabetes right now, causing you the most concern, or is the most worrisome to you about your diabetes?   Making healty food and beverage choices    Self-care barriers None    Self-management support Doctor's office;Family    Patient Concerns Nutrition/Meal planning;Glycemic Control    Special Needs None    Preferred Learning Style Auditory;Visual;Hands on    Learning Readiness Change in progress    How often do you need to have someone help you when you read instructions, pamphlets, or other written materials from your doctor or pharmacy? 1 - Never    What is the last grade level you completed in school? some college      Pre-Education Assessment   Patient understands the diabetes disease and treatment process. Needs Instruction    Patient understands incorporating nutritional management into lifestyle. Needs Instruction    Patient undertands incorporating physical  activity into lifestyle. Comprehends key points    Patient understands using medications safely. Demonstrates understanding / competency    Patient understands monitoring blood glucose, interpreting and using results Comprehends key points    Patient understands prevention, detection, and treatment of acute complications. Needs Instruction    Patient understands prevention, detection, and treatment of chronic complications. Needs Instruction    Patient understands how to develop strategies to address psychosocial issues. Needs Instruction    Patient understands how to develop strategies to promote health/change behavior. Needs Instruction      Complications   How often do you check your blood sugar? 3-4 times/day    Fasting Blood glucose range (mg/dL) 51-884   She reports fasting blood sugars less than 95 mg/dL in last 2 weeks.   Postprandial Blood glucose range (mg/dL) 16-606;301-601   She reports pp's less than 120's mg/dL in the last 2 weeks except for 3 supper readings of 146, 129 and 122 mg/dL related to food choices.   Have you had a dilated eye exam in the past 12 months? Yes    Have you had a dental exam in the past 12 months? No    Are you checking your feet? Yes    How many days per week are you checking your feet? 7      Dietary Intake   Breakfast skips    Lunch 11-12 noon 1 packets of flavored oatmeal, occasional  grits, left overs    Dinner chicken, beef, occasional pork; peas, beans, corn, green beans, broccoli, cabbage, pizza, [place], [place] - decreased potatoes, pasta, rice    Beverage(s) water, Gatorade zero, juice, milk      Activity / Exercise   Activity / Exercise Type Light (walking / raking leaves)    How many days per week do you exercise? 4.5    How many minutes per day do you exercise? 30    Total minutes per week of exercise 135      Patient Education   Previous Diabetes Education No   informal education from family members with Diabetes   Disease  Pathophysiology Definition of diabetes, type 1 and 2, and the diagnosis of diabetes;Factors that contribute to the development of diabetes    Healthy Eating Role of diet in the treatment of diabetes and the relationship between the three main macronutrients and blood glucose level;Food label reading, portion sizes and measuring food.;Reviewed blood glucose goals for pre and post meals and how to evaluate the patients' food intake on their blood glucose level.    Being Active Role of exercise on diabetes management, blood pressure control and cardiac health.    Medications Other (comment)   Limited use of oral medications during pregnancy and potential for insulin.   Monitoring Purpose and frequency of SMBG.;Taught/discussed recording of test results and interpretation of SMBG.;Identified appropriate SMBG and/or A1C goals.;Ketone testing, when, how.    Chronic complications Relationship between chronic complications and blood glucose control    Diabetes Stress and Support Identified and addressed patients feelings and concerns about diabetes    Preconception care Pregnancy and GDM  Role of pre-pregnancy blood glucose control on the development of the fetus;Reviewed with patient blood glucose goals with pregnancy;Role of family planning for patients with diabetes      Individualized Goals (developed by patient)   Reducing Risk Other (comment)   improve blood sugars     Outcomes   Expected Outcomes Demonstrated interest in learning. Expect positive outcomes         Individualized Plan for Diabetes Self-Management Training:   Learning Objective:  Patient will have a greater understanding of diabetes self-management. Patient education plan is to attend individual and/or group sessions per assessed needs and concerns.   Plan:   Patient Instructions  Read booklet on Gestational Diabetes Follow Gestational Meal Planning Guidelines Don't skip meals - eat 1 protein and 1 carbohydrate serving Avoid  sugar sweetened drinks (juice) Increase water intake 6-8 servings/day Complete a 3 Day Food Record and bring to next appointment Check blood sugars 4 x day - before breakfast and 2 hrs after every meal and record  Bring blood sugar log to all appointments Purchase urine ketone strips if instructed by MD and check urine ketones every am:  If + increase bedtime snack to 1 protein and 2 carbohydrate servings Walk 20-30 minutes at least 5 x week if permitted by MD  Expected Outcomes:  Demonstrated interest in learning. Expect positive outcomes  Education material provided:  Gestational Booklet Gestational Meal Planning Guidelines Simple Meal Plan 3 Day Food Record Goals for a Healthy Pregnancy   If problems or questions, patient to contact team via:   Johny Drilling, RN, [place], [place] [phone]  Future DSME appointment: December 31, 2021 with the dietitian

## 2021-12-22 ENCOUNTER — Encounter: Payer: Self-pay | Admitting: Obstetrics

## 2021-12-22 ENCOUNTER — Ambulatory Visit (INDEPENDENT_AMBULATORY_CARE_PROVIDER_SITE_OTHER): Payer: Medicaid Other | Admitting: Obstetrics

## 2021-12-22 VITALS — BP 119/72 | HR 73 | Wt 133.0 lb

## 2021-12-22 DIAGNOSIS — Z3A32 32 weeks gestation of pregnancy: Secondary | ICD-10-CM

## 2021-12-22 DIAGNOSIS — O0993 Supervision of high risk pregnancy, unspecified, third trimester: Secondary | ICD-10-CM

## 2021-12-22 LAB — POCT URINALYSIS DIPSTICK OB
Bilirubin, UA: NEGATIVE
Blood, UA: NEGATIVE
Glucose, UA: NEGATIVE
Ketones, UA: NEGATIVE
Leukocytes, UA: NEGATIVE
Nitrite, UA: NEGATIVE
POC,PROTEIN,UA: NEGATIVE
Spec Grav, UA: 1.02 (ref 1.010–1.025)
Urobilinogen, UA: 0.2 E.U./dL
pH, UA: 7.5 (ref 5.0–8.0)

## 2021-12-22 NOTE — Progress Notes (Signed)
ROB at [redacted]w[redacted]d. Active baby. Denies ctx, LOF, and vaginal bleeding. Some lower belly cramps when active. Bethany Williams has been figuring out how to modify her diet for good blood sugar control. Over the past two weeks, she has had only one elevated FSBS and 3 elevated PP. She is having difficulty sleeping, which is affecting her mood a little. Discussed sleep hygiene, Benadryl/Unasom PRN. Reviewed s/s of PTL. RTC in 2 weeks.  Lloyd Huger, CNM

## 2021-12-23 ENCOUNTER — Encounter: Payer: Self-pay | Admitting: Licensed Practical Nurse

## 2021-12-23 ENCOUNTER — Telehealth: Payer: Medicaid Other

## 2021-12-23 NOTE — Telephone Encounter (Signed)
[name] from [facility] department called to inform Dr. Amalia Hailey that patient was seen today and is requesting a order for a breast pump. Mrs. Bethany Williams request that you discuss this with patient at her next scheduled office visit 01/06/22. KW

## 2021-12-23 NOTE — Telephone Encounter (Signed)
Completed [employer] request. Patient has been made aware.

## 2021-12-31 ENCOUNTER — Encounter: Payer: Medicaid Other | Attending: Advanced Practice Midwife | Admitting: Dietician

## 2021-12-31 ENCOUNTER — Encounter: Payer: Self-pay | Admitting: Dietician

## 2021-12-31 VITALS — BP 100/62 | Ht 60.0 in | Wt 134.9 lb

## 2021-12-31 DIAGNOSIS — O24410 Gestational diabetes mellitus in pregnancy, diet controlled: Secondary | ICD-10-CM | POA: Insufficient documentation

## 2021-12-31 NOTE — Patient Instructions (Signed)
Plan to eat a meal or snack every 4 hours during the day while awake. Include a protein and a starch or fruit, or a glass of milk if not hungry for a full meal. Continue to drink plenty of water and other sugar free, caffeine free fluids.  Great job including some exercise! Keep it up!

## 2021-12-31 NOTE — Progress Notes (Signed)
Patient's BG record indicates fasting BGs ranging 85-93, and post-meal BGs ranging 88-115. Patient denies any incidents of hypoglycemia. Patient's food diary indicates inclusion of protein + controlled amounts of carbs with meals, veg sometimes lacking. She drinks milk early am when daughter goes to school, then sleeps longer due to disrupted nighttime sleep. Does eat lunch and dinner meals + evening snack.  Provided basic balanced meal plan, and wrote individualized menus based on patient's food preferences. Reviewed basic meal planning and goals for carb intake.  Advised having a meal or snack about every 4 hours while awake to ensure adequate nutrition overall and kep BGs stable. Instructed patient on food safety, including avoidance of Listeriosis, and limiting mercury from fish. Discussed importance of maintaining healthy lifestyle habits to reduce risk of Type 2 DM as well as Gestational DM with any future pregnancies. Advised patient to use any remaining testing supplies to test some BGs after delivery, and to have BG tested ideally annually, as well as prior to attempting future pregnancies.

## 2022-01-04 ENCOUNTER — Other Ambulatory Visit: Payer: Self-pay

## 2022-01-04 DIAGNOSIS — O24410 Gestational diabetes mellitus in pregnancy, diet controlled: Secondary | ICD-10-CM

## 2022-01-04 DIAGNOSIS — Z8759 Personal history of other complications of pregnancy, childbirth and the puerperium: Secondary | ICD-10-CM

## 2022-01-04 DIAGNOSIS — Z8751 Personal history of pre-term labor: Secondary | ICD-10-CM

## 2022-01-06 ENCOUNTER — Ambulatory Visit: Payer: Medicaid Other | Attending: Maternal & Fetal Medicine

## 2022-01-06 ENCOUNTER — Ambulatory Visit (INDEPENDENT_AMBULATORY_CARE_PROVIDER_SITE_OTHER): Payer: Medicaid Other | Admitting: Obstetrics and Gynecology

## 2022-01-06 ENCOUNTER — Encounter: Payer: Self-pay | Admitting: Obstetrics and Gynecology

## 2022-01-06 ENCOUNTER — Other Ambulatory Visit: Payer: Self-pay

## 2022-01-06 VITALS — BP 116/74 | HR 100 | Temp 98.0°F | Ht 60.0 in | Wt 137.0 lb

## 2022-01-06 VITALS — BP 105/79 | HR 82 | Wt 136.9 lb

## 2022-01-06 DIAGNOSIS — Z8751 Personal history of pre-term labor: Secondary | ICD-10-CM | POA: Diagnosis not present

## 2022-01-06 DIAGNOSIS — O0993 Supervision of high risk pregnancy, unspecified, third trimester: Secondary | ICD-10-CM

## 2022-01-06 DIAGNOSIS — Z3A34 34 weeks gestation of pregnancy: Secondary | ICD-10-CM

## 2022-01-06 DIAGNOSIS — O24410 Gestational diabetes mellitus in pregnancy, diet controlled: Secondary | ICD-10-CM

## 2022-01-06 DIAGNOSIS — O09293 Supervision of pregnancy with other poor reproductive or obstetric history, third trimester: Secondary | ICD-10-CM | POA: Insufficient documentation

## 2022-01-06 DIAGNOSIS — Z3482 Encounter for supervision of other normal pregnancy, second trimester: Secondary | ICD-10-CM

## 2022-01-06 DIAGNOSIS — Z8759 Personal history of other complications of pregnancy, childbirth and the puerperium: Secondary | ICD-10-CM | POA: Insufficient documentation

## 2022-01-06 DIAGNOSIS — O24419 Gestational diabetes mellitus in pregnancy, unspecified control: Secondary | ICD-10-CM

## 2022-01-06 DIAGNOSIS — Z3480 Encounter for supervision of other normal pregnancy, unspecified trimester: Secondary | ICD-10-CM

## 2022-01-06 DIAGNOSIS — O09213 Supervision of pregnancy with history of pre-term labor, third trimester: Secondary | ICD-10-CM | POA: Diagnosis not present

## 2022-01-06 LAB — POCT URINALYSIS DIPSTICK OB
Bilirubin, UA: NEGATIVE
Blood, UA: NEGATIVE
Glucose, UA: NEGATIVE
Ketones, UA: NEGATIVE
Leukocytes, UA: NEGATIVE
Nitrite, UA: NEGATIVE
POC,PROTEIN,UA: NEGATIVE
Spec Grav, UA: 1.015 (ref 1.010–1.025)
Urobilinogen, UA: 0.2 E.U./dL
pH, UA: 6 (ref 5.0–8.0)

## 2022-01-06 NOTE — Progress Notes (Signed)
ROB: She reports no problems.  Denies contractions.  Reports daily fetal movement.  Sugar log looks excellent.  Cultures next visit.

## 2022-01-09 ENCOUNTER — Encounter: Payer: Self-pay | Admitting: Obstetrics and Gynecology

## 2022-01-09 DIAGNOSIS — F1290 Cannabis use, unspecified, uncomplicated: Secondary | ICD-10-CM | POA: Insufficient documentation

## 2022-01-09 DIAGNOSIS — Z8759 Personal history of other complications of pregnancy, childbirth and the puerperium: Secondary | ICD-10-CM | POA: Insufficient documentation

## 2022-01-18 ENCOUNTER — Other Ambulatory Visit (HOSPITAL_COMMUNITY)
Admission: RE | Admit: 2022-01-18 | Discharge: 2022-01-18 | Disposition: A | Discharge status: Home / Self Care | Payer: Medicaid Other | Source: Ambulatory Visit | Attending: Obstetrics and Gynecology | Admitting: Obstetrics and Gynecology

## 2022-01-18 ENCOUNTER — Ambulatory Visit (INDEPENDENT_AMBULATORY_CARE_PROVIDER_SITE_OTHER): Payer: Medicaid Other | Admitting: Obstetrics and Gynecology

## 2022-01-18 ENCOUNTER — Encounter: Payer: Self-pay | Admitting: Obstetrics and Gynecology

## 2022-01-18 VITALS — BP 118/82 | HR 72 | Wt 137.7 lb

## 2022-01-18 DIAGNOSIS — Z3480 Encounter for supervision of other normal pregnancy, unspecified trimester: Secondary | ICD-10-CM

## 2022-01-18 DIAGNOSIS — O24410 Gestational diabetes mellitus in pregnancy, diet controlled: Secondary | ICD-10-CM

## 2022-01-18 DIAGNOSIS — Z113 Encounter for screening for infections with a predominantly sexual mode of transmission: Secondary | ICD-10-CM

## 2022-01-18 DIAGNOSIS — Z3685 Encounter for antenatal screening for Streptococcus B: Secondary | ICD-10-CM

## 2022-01-18 DIAGNOSIS — Z3A36 36 weeks gestation of pregnancy: Secondary | ICD-10-CM

## 2022-01-18 DIAGNOSIS — O0993 Supervision of high risk pregnancy, unspecified, third trimester: Secondary | ICD-10-CM

## 2022-01-18 LAB — POCT URINALYSIS DIPSTICK OB
Bilirubin, UA: NEGATIVE
Blood, UA: NEGATIVE
Glucose, UA: NEGATIVE
Ketones, UA: NEGATIVE
Nitrite, UA: NEGATIVE
Spec Grav, UA: 1.01 (ref 1.010–1.025)
Urobilinogen, UA: 0.2 E.U./dL
pH, UA: 7 (ref 5.0–8.0)

## 2022-01-18 NOTE — Progress Notes (Signed)
ROB: Patient doing well, no complaints. Has questions regarding pain management in labor, would like to avoid epidural if possible. Also has questions regarding delayed cord clamping. All questions answered.  Blood sugars well controlled.36 week cultures collected (patient able to perform self-swab). RTC in 1 week.

## 2022-01-18 NOTE — Progress Notes (Signed)
ROB [redacted]w[redacted]d: She is doing well. She has good fetal movement. Cultures done today.

## 2022-01-19 LAB — CERVICOVAGINAL ANCILLARY ONLY
Chlamydia: NEGATIVE
Comment: NEGATIVE
Comment: NORMAL
Neisseria Gonorrhea: NEGATIVE

## 2022-01-20 LAB — STREP GP B NAA: Strep Gp B NAA: NEGATIVE

## 2022-01-26 ENCOUNTER — Encounter: Payer: Self-pay | Admitting: Obstetrics and Gynecology

## 2022-01-27 ENCOUNTER — Ambulatory Visit (INDEPENDENT_AMBULATORY_CARE_PROVIDER_SITE_OTHER): Payer: Medicaid Other | Admitting: Obstetrics

## 2022-01-27 VITALS — BP 120/78 | HR 69 | Wt 139.0 lb

## 2022-01-27 DIAGNOSIS — Z3A37 37 weeks gestation of pregnancy: Secondary | ICD-10-CM

## 2022-01-27 DIAGNOSIS — Z3483 Encounter for supervision of other normal pregnancy, third trimester: Secondary | ICD-10-CM

## 2022-01-27 NOTE — Progress Notes (Signed)
ROB at [redacted]w[redacted]d. Active baby. Bethany Williams has been having some runs of irregular, painful contractions and bloody show. She is still not sleeping well, but is doing okay otherwise. She is ready for labor. Desires SVE today: 1.5/70/-1, posterior. Reviewed when to go the hospital. RTC in one week.  Guadlupe Spanish, CNM

## 2022-01-28 ENCOUNTER — Other Ambulatory Visit: Payer: Self-pay

## 2022-01-28 ENCOUNTER — Inpatient Hospital Stay
Admission: EM | Admit: 2022-01-28 | Discharge: 2022-01-29 | DRG: 807 | Disposition: A | Discharge status: Home / Self Care | Payer: Medicaid Other | Attending: Licensed Practical Nurse | Admitting: Licensed Practical Nurse

## 2022-01-28 ENCOUNTER — Encounter: Payer: Self-pay | Admitting: Obstetrics and Gynecology

## 2022-01-28 DIAGNOSIS — F4310 Post-traumatic stress disorder, unspecified: Secondary | ICD-10-CM | POA: Diagnosis not present

## 2022-01-28 DIAGNOSIS — Z23 Encounter for immunization: Secondary | ICD-10-CM | POA: Diagnosis not present

## 2022-01-28 DIAGNOSIS — F418 Other specified anxiety disorders: Secondary | ICD-10-CM

## 2022-01-28 DIAGNOSIS — O99344 Other mental disorders complicating childbirth: Secondary | ICD-10-CM | POA: Diagnosis present

## 2022-01-28 DIAGNOSIS — Z87891 Personal history of nicotine dependence: Secondary | ICD-10-CM | POA: Diagnosis not present

## 2022-01-28 DIAGNOSIS — O479 False labor, unspecified: Secondary | ICD-10-CM | POA: Diagnosis present

## 2022-01-28 DIAGNOSIS — F32A Depression, unspecified: Secondary | ICD-10-CM | POA: Diagnosis not present

## 2022-01-28 DIAGNOSIS — O09293 Supervision of pregnancy with other poor reproductive or obstetric history, third trimester: Secondary | ICD-10-CM

## 2022-01-28 DIAGNOSIS — Z7982 Long term (current) use of aspirin: Secondary | ICD-10-CM

## 2022-01-28 DIAGNOSIS — O24420 Gestational diabetes mellitus in childbirth, diet controlled: Secondary | ICD-10-CM | POA: Diagnosis not present

## 2022-01-28 DIAGNOSIS — O24410 Gestational diabetes mellitus in pregnancy, diet controlled: Secondary | ICD-10-CM | POA: Diagnosis not present

## 2022-01-28 DIAGNOSIS — Z3A37 37 weeks gestation of pregnancy: Secondary | ICD-10-CM

## 2022-01-28 DIAGNOSIS — O26893 Other specified pregnancy related conditions, third trimester: Secondary | ICD-10-CM | POA: Diagnosis not present

## 2022-01-28 LAB — CBC
HCT: 35.5 % — ABNORMAL LOW (ref 36.0–46.0)
Hemoglobin: 12.4 g/dL (ref 12.0–15.0)
MCH: 31.4 pg (ref 26.0–34.0)
MCHC: 34.9 g/dL (ref 30.0–36.0)
MCV: 89.9 fL (ref 80.0–100.0)
Platelets: 228 10*3/uL (ref 150–400)
RBC: 3.95 MIL/uL (ref 3.87–5.11)
RDW: 12.4 % (ref 11.5–15.5)
WBC: 14.5 10*3/uL — ABNORMAL HIGH (ref 4.0–10.5)
nRBC: 0 % (ref 0.0–0.2)

## 2022-01-28 LAB — TYPE AND SCREEN
ABO/RH(D): A POS
Antibody Screen: NEGATIVE

## 2022-01-28 LAB — ABO/RH: ABO/RH(D): A POS

## 2022-01-28 LAB — HIV ANTIBODY (ROUTINE TESTING W REFLEX): HIV Screen 4th Generation wRfx: NONREACTIVE

## 2022-01-28 MED ORDER — FLUOXETINE HCL 10 MG PO CAPS
10.0000 mg | ORAL_CAPSULE | Freq: Every day | ORAL | Status: DC
  Administered 2022-01-28: 10 mg via ORAL
  Filled 2022-01-28: qty 1

## 2022-01-28 MED ORDER — DIBUCAINE (PERIANAL) 1 % EX OINT
1.0000 | TOPICAL_OINTMENT | CUTANEOUS | Status: DC | PRN
  Filled 2022-01-28: qty 28

## 2022-01-28 MED ORDER — OXYTOCIN BOLUS FROM INFUSION
333.0000 mL | Freq: Once | INTRAVENOUS | Status: AC
  Administered 2022-01-28: 333 mL via INTRAVENOUS

## 2022-01-28 MED ORDER — SOD CITRATE-CITRIC ACID 500-334 MG/5ML PO SOLN: 30.0000 mL | ORAL | Status: DC | PRN

## 2022-01-28 MED ORDER — PRENATAL MULTIVITAMIN CH
1.0000 | ORAL_TABLET | Freq: Every day | ORAL | Status: DC
  Administered 2022-01-29: 1 via ORAL
  Filled 2022-01-28: qty 1

## 2022-01-28 MED ORDER — LACTATED RINGERS IV SOLN: INTRAVENOUS | Status: DC

## 2022-01-28 MED ORDER — DOCUSATE SODIUM 100 MG PO CAPS
100.0000 mg | ORAL_CAPSULE | Freq: Two times a day (BID) | ORAL | Status: DC
  Administered 2022-01-28 – 2022-01-29 (×2): 100 mg via ORAL
  Filled 2022-01-28 (×2): qty 1

## 2022-01-28 MED ORDER — LACTATED RINGERS IV SOLN: 500.0000 mL | INTRAVENOUS | Status: DC | PRN

## 2022-01-28 MED ORDER — IBUPROFEN 600 MG PO TABS
600.0000 mg | ORAL_TABLET | Freq: Four times a day (QID) | ORAL | Status: DC
  Administered 2022-01-28 – 2022-01-29 (×4): 600 mg via ORAL
  Filled 2022-01-28 (×4): qty 1

## 2022-01-28 MED ORDER — BENZOCAINE-MENTHOL 20-0.5 % EX AERO
1.0000 | INHALATION_SPRAY | CUTANEOUS | Status: DC | PRN
  Filled 2022-01-28: qty 56

## 2022-01-28 MED ORDER — MISOPROSTOL 200 MCG PO TABS
ORAL_TABLET | ORAL | Status: AC
  Filled 2022-01-28: qty 4

## 2022-01-28 MED ORDER — ACETAMINOPHEN 500 MG PO TABS
1000.0000 mg | ORAL_TABLET | Freq: Four times a day (QID) | ORAL | Status: DC
  Administered 2022-01-28 – 2022-01-29 (×4): 1000 mg via ORAL
  Filled 2022-01-28 (×4): qty 2

## 2022-01-28 MED ORDER — OXYTOCIN 10 UNIT/ML IJ SOLN
INTRAMUSCULAR | Status: AC
  Filled 2022-01-28: qty 2

## 2022-01-28 MED ORDER — COCONUT OIL OIL: 1.0000 | TOPICAL_OIL | Status: DC | PRN

## 2022-01-28 MED ORDER — LAMOTRIGINE 100 MG PO TABS
100.0000 mg | ORAL_TABLET | Freq: Every day | ORAL | Status: DC
  Administered 2022-01-28: 100 mg via ORAL
  Filled 2022-01-28 (×3): qty 1

## 2022-01-28 MED ORDER — LIDOCAINE HCL (PF) 1 % IJ SOLN
INTRAMUSCULAR | Status: AC
  Filled 2022-01-28: qty 30

## 2022-01-28 MED ORDER — DIPHENHYDRAMINE HCL 25 MG PO CAPS: 25.0000 mg | ORAL_CAPSULE | Freq: Four times a day (QID) | ORAL | Status: DC | PRN

## 2022-01-28 MED ORDER — OXYTOCIN-SODIUM CHLORIDE 30-0.9 UT/500ML-% IV SOLN
2.5000 [IU]/h | INTRAVENOUS | Status: DC
  Administered 2022-01-28: 2.5 [IU]/h via INTRAVENOUS
  Filled 2022-01-28: qty 500

## 2022-01-28 MED ORDER — SIMETHICONE 80 MG PO CHEW: 80.0000 mg | CHEWABLE_TABLET | ORAL | Status: DC | PRN

## 2022-01-28 MED ORDER — ONDANSETRON HCL 4 MG/2ML IJ SOLN: 4.0000 mg | INTRAMUSCULAR | Status: DC | PRN

## 2022-01-28 MED ORDER — FENTANYL CITRATE (PF) 100 MCG/2ML IJ SOLN: 50.0000 ug | INTRAMUSCULAR | Status: DC | PRN

## 2022-01-28 MED ORDER — WITCH HAZEL-GLYCERIN EX PADS
1.0000 | MEDICATED_PAD | CUTANEOUS | Status: DC | PRN
  Filled 2022-01-28: qty 100

## 2022-01-28 MED ORDER — LIDOCAINE HCL (PF) 1 % IJ SOLN: 30.0000 mL | INTRAMUSCULAR | Status: DC | PRN

## 2022-01-28 MED ORDER — ZOLPIDEM TARTRATE 5 MG PO TABS: 5.0000 mg | ORAL_TABLET | Freq: Every evening | ORAL | Status: DC | PRN

## 2022-01-28 MED ORDER — OXYCODONE HCL 5 MG PO TABS: 10.0000 mg | ORAL_TABLET | ORAL | Status: DC | PRN

## 2022-01-28 MED ORDER — ONDANSETRON HCL 4 MG PO TABS: 4.0000 mg | ORAL_TABLET | ORAL | Status: DC | PRN

## 2022-01-28 MED ORDER — OXYCODONE HCL 5 MG PO TABS: 5.0000 mg | ORAL_TABLET | ORAL | Status: DC | PRN

## 2022-01-28 MED ORDER — AMMONIA AROMATIC IN INHA
RESPIRATORY_TRACT | Status: AC
  Filled 2022-01-28: qty 10

## 2022-01-28 MED ORDER — ONDANSETRON HCL 4 MG/2ML IJ SOLN: 4.0000 mg | Freq: Four times a day (QID) | INTRAMUSCULAR | Status: DC | PRN

## 2022-01-28 NOTE — Lactation Note (Signed)
This note was copied from a baby's chart. Lactation Consultation Note  Patient Name: Bethany Williams [id number] Date: 01/28/2022 Reason for consult: L&D Initial assessment;1st time breastfeeding;Difficult latch;Early term 37-38.6wks;Breastfeeding assistance;RN request Age:25 hours  Maternal Data  This is mom's 2nd baby, SVD. Mom with history of diet controlled gestational diabetes, anxiety, depression, PTSD, e-cigarette use(quit 2018). Mom reports she exclusively pumped for 6 months for her first baby born at 25 weeks, she did not breastfeed. Per mom she had an abundant milk supply. She has been pumping prior to this baby's delivery and has accumulated colostrum which she is storing at home in the freezer. Her plan is to breastfeed this baby.  Has patient been taught Hand Expression?: Yes Does the patient have breastfeeding experience prior to this delivery?: No (Mom exclusively pumped for 6 months for her previous child born at 47 weeks who is now 6. This is her first time breastfeeding.)  Feeding Mother's Current Feeding Choice: Breast Milk Provided mom with tips and strategies to maximize position and latch technique. Mom was able to independently position and latch baby post instruction.  LATCH Score Latch: Grasps breast easily, tongue down, lips flanged, rhythmical sucking. (with breast "sandwiching" technique baby latched well.)  Audible Swallowing: Spontaneous and intermittent  Type of Nipple: Everted at rest and after stimulation  Comfort (Breast/Nipple): Soft / non-tender  Hold (Positioning): Assistance needed to correctly position infant at breast and maintain latch.  LATCH Score: 9   Interventions Interventions: Breast feeding basics reviewed;Assisted with latch;Breast massage;Breast compression;Adjust position;Support pillows;Education (REviewed early term breastfeeding behaviors and potential for inconsistent breastfeeding.)  Discharge Pump: Personal (Mom has Medela  double electric pump.)  Consult Status Consult Status: Follow-up from L&D  Update provided to care nurse.  Fuller Song 01/28/2022, 2:53 PM

## 2022-01-28 NOTE — OB Triage Note (Signed)
Patient here for CTX since 8 am that are every 2-4 mins apart rates ctx a ten.

## 2022-01-28 NOTE — H&P (Signed)
OB History & Physical   History of Present Illness:  Chief Complaint:   HPI:  Bethany Williams is a 26 y.o. G3P0101 female at [redacted]w[redacted]d dated by L and 9wkUS.  She presents to L&D for contractions. Contractions started around 4 am.  She felt something drip down her leg at that time, but is not sure if was her fluid or not. Endorses + FM. She is here with her partner. She had early and regular prenatal care. Her pregnancy has been complicated the the list below.    Pregnancy Issues: 1. Diet controlled GDM 2. Anxiety/Depression 3.PTSD  4. Hx birth at 29wk, suspected placental abruption   Maternal Medical History:   Past Medical History:  Diagnosis Date   Anxiety    Depression    Family history of ovarian cancer    12/22 cancer genetic testing letter sent   Gestational diabetes    PTSD (post-traumatic stress disorder)     Past Surgical History:  Procedure Laterality Date   WISDOM TOOTH EXTRACTION      Allergies  Allergen Reactions   Sulfa Antibiotics Anaphylaxis   Dilaudid [Hydromorphone]    Latex    Ketorolac Rash    Prior to Admission medications   Medication Sig Start Date End Date Taking? Authorizing Provider  aspirin EC (BAYER ASPIRIN EC LOW DOSE) 81 MG tablet  09/16/21   [provider]  Cholecalciferol (VITAMIN D3) 250 MCG (10000 UT) TABS Take 1 tablet by mouth daily.    [provider]  FLUoxetine (PROZAC) 10 MG capsule Take 20 mg by mouth every morning. 09/27/21   [provider]  lamoTRIgine (LAMICTAL) 100 MG tablet Take 100 mg by mouth daily. 06/04/21   [provider]  Prenatal Vit-Fe Fumarate-FA (MULTIVITAMIN-PRENATAL) 27-0.8 MG TABS tablet Take 1 tablet by mouth daily at 12 noon.    [provider]     Prenatal care site: [facility]   Social History: She  reports that she quit smoking about 5 years ago. Her smoking use included e-cigarettes. She has never used smokeless tobacco. She reports that she does not  currently use alcohol. She reports that she does not use drugs.  Family History: family history includes Diabetes in her maternal aunt, maternal grandfather, maternal uncle, and paternal grandmother; Heart disease in her mother; Hypertension in her maternal grandfather, maternal grandmother, mother, and sister; Leukemia in her father; Lung cancer in her paternal grandfather; Ovarian cancer (age of onset: 1) in her mother; Stroke in her mother.   Review of Systems: A full review of systems was performed and negative except as noted in the HPI.     Physical Exam:  Vital Signs: LMP 05/11/2021 (Approximate)  General: no acute distress.  HEENT: normocephalic, atraumatic Heart: regular rate & rhythm.  No murmurs/rubs/gallops Lungs: clear to auscultation bilaterally, normal respiratory effort Abdomen: soft, gravid, non-tender;  EFW: 6lbs  Pelvic:   External: Normal external female genitalia  Cervix: Dilation: 7 / Effacement (%): 90 / Station: 0    Extremities: non-tender, symmetric, no edema bilaterally.   Neurologic: Alert & oriented x 3.    No results found for this or any previous visit (from the past 24 hour(s)).  Pertinent Results:  Prenatal Labs: Blood type/Rh A+  Antibody screen neg  Rubella Immune  Varicella Non Immune  RPR NR  HBsAg Neg  HIV NR  GC neg  Chlamydia neg  Genetic screening negative  1 hour GTT 209  3 hour GTT   GBS neg  FHT: baseline 125, moderate variability, pos accel, neg decel TOCO:q 3  SVE:  Dilation: 7 / Effacement (%): 90 / Station: 0    Cephalic by [employer]  [place] MFM OB FOLLOW UP  Result Date: 01/06/2022 ----------------------------------------------------------------------  OBSTETRICS REPORT                       (Signed Final 01/06/2022 01:55 pm) ---------------------------------------------------------------------- Patient Info  ID #:       [id number]                          D.O.B.:  31-Oct-1995 (26 yrs)  Name:       Bethany Williams                    Visit Date: 01/06/2022 12:59 pm ---------------------------------------------------------------------- Performed By  Attending:        Lin Landsman      Ref. Address:     Consuella Lose                    MD  Performed By:     Burt Knack RDMS     [facility]                                                             [facility]  Referred By:      Courtney Heys                    [facility] ---------------------------------------------------------------------- Orders  #  Description                           Code        Ordered By  1  [place] MFM OB FOLLOW UP                   10626.94    Lin Landsman ----------------------------------------------------------------------  #  Order #                     Accession #                Episode #  1  [id number]                   [id number]                 [id number] ---------------------------------------------------------------------- Indications  Gestational diabetes in pregnancy, diet  O24.410  controlled  [redacted] weeks gestation of pregnancy                Z3A.34  Poor obstetric history (prior pre-term         O09.219  delivery/abruption 29 wks)  LR-NIPTS ---------------------------------------------------------------------- Vital Signs                            Pulse:  100  BP:          116/74 ---------------------------------------------------------------------- Fetal Evaluation  Num Of Fetuses:         1  Fetal Heart Rate(bpm):  144  Cardiac Activity:       Observed  Presentation:           Cephalic  Placenta:               Posterior  P. Cord Insertion:      Visualized  Amniotic Fluid  AFI FV:      Within normal limits  AFI Sum(cm)     %Tile       Largest Pocket(cm)  14.41           51          5.49  RUQ(cm)       RLQ(cm)       LUQ(cm)        LLQ(cm)  5.49          2.67          3.66           2.59  ---------------------------------------------------------------------- Biometry  BPD:      92.1  mm     G. Age:  37w 3d         99  %    CI:        77.49   %    70 - 86                                                          FL/HC:      18.6   %    19.4 - 21.8  HC:    331.19   mm     G. Age:  37w 5d         92  %    HC/AC:      1.00        0.96 - 1.11  AC:    332.15   mm     G. Age:  37w 1d         99  %    FL/BPD:     66.8   %    71 - 87  FL:      61.52  mm     G. Age:  31w 6d        2.6  %    FL/AC:      18.5   %    20 - 24  Est. FW:    2807  gm      6 lb 3 oz     88  % ---------------------------------------------------------------------- OB History  Gravidity:    2         Term:   0        Prem:  1        SAB:   0  TOP:          0       Ectopic:  0        Living: 1 ---------------------------------------------------------------------- Gestational Age  LMP:           34w 2d        Date:  05/11/21                  EDD:   02/15/22  U/S Today:     36w 0d                                        EDD:   02/03/22  Best:          34w 2d     Det. By:  LMP  (05/11/21)          EDD:   02/15/22 ---------------------------------------------------------------------- Anatomy  Cranium:               Appears normal         Kidneys:                Appear normal  Stomach:               Appears normal, left   Bladder:                Appears normal                         sided  Other:  Alll anatomy previously seen ---------------------------------------------------------------------- Impression  Follow up growth due to A1GDM  Normal interval growth with measurements consistent with  dates  Good fetal movement and amniotic fluid volume  Ms. Stonesifer reports that her blood sugars are within range. ---------------------------------------------------------------------- Recommendations  Repeat growth in 4 weeks. ----------------------------------------------------------------------              Lin Landsman, MD Electronically Signed  Final Report   01/06/2022 01:55 pm ----------------------------------------------------------------------   Assessment:  Bethany Williams is a 26 y.o. G36P0101 female at [redacted]w[redacted]d with labor.   Plan:  Admit to Labor & Delivery CBC, T&S, regular diet, HL IV GBS  neg, membranes intact  Consents obtained. Intermittent monitoring Pain management: desires Nitrous   ----- Carie Caddy, CNM   [place]

## 2022-01-28 NOTE — Discharge Summary (Signed)
Obstetrical Discharge Summary  Date of Admission: 01/28/2022 Date of Discharge: 01/28/2022  Primary OB: [facility]   Gestational Age at Delivery: [redacted]w[redacted]d   Antepartum complications: gestational diabetes, anxiety/depression, PTSD, hx 29wk birth  Reason for Admission: labor Date of Delivery: 01/28/2022  Delivered By: Siri Cole, CNM  Delivery Type: spontaneous vaginal delivery Intrapartum complications/course: labial hematoma  Anesthesia: local and Nitrous  Placenta: Delivered and expressed via active management. Intact: yes. To pathology: no.  Laceration: vaginal and periurethral Episiotomy: none EBL: Baby: Liveborn female, APGARs 6/7, weight *** g.    Discharge Diagnosis: Delivered. ***  Postpartum course: *** Discharge Vital Signs:  Current Vital Signs 24h Vital Sign Ranges  T   No data recorded  BP (!) 136/57 BP  Min: 125/73  Max: 137/70  HR 67 Pulse  Avg: 75.8  Min: 67  Max: 82  RR   No data recorded  SaO2     No data recorded       24 Hour I/O Current Shift I/O  Time Ins Outs No intake/output data recorded. 12/01 0701 - 12/01 1900 In: -  Out: 150    ***Choose one or both***  Patient Vitals for the past 6 hrs:  BP Pulse  01/28/22 1344 (!) 136/57 67  01/28/22 1329 131/77 80  01/28/22 1324 125/73 82  01/28/22 1319 (!) 125/58 72  01/28/22 1314 137/70 78    Discharge Exam:  NAD Perineum: *** Abdomen: firm fundus below the umbilicus, NTTP, non distended, +bowel sounds.  Incision c/d/i with *** RRR no MRGs CTAB Ext: no c/c/e***  Recent Labs  Lab 01/28/22 1119  WBC 14.5*  HGB 12.4  HCT 35.5*  PLT 228    Disposition: Home  Rh Immune globulin given: no Rubella vaccine given: no Tdap vaccine given in AP or PP setting: yes Flu vaccine given in AP or PP setting: no  Contraception:  undecided   Prenatal/Postnatal Panel: A POS//Rubella Immune//Varicella Not immune//RPR negative//HIV negative/HepB Surface Ag negative//pap no abnormalities (date:  01/2021)//plans to breastfeed  Plan:  Bethany Williams was discharged to home in good condition. Follow-up appointment with LMD in 2 weeks and 6 wks for a PP visit  Future Appointments  Date Time Provider Department Center  02/04/2022  4:15 PM Marny Lowenstein, PA-C [facility] None    Discharge Medications: Allergies as of 01/28/2022       Reactions   Sulfa Antibiotics Anaphylaxis   Dilaudid [hydromorphone]    Latex    Ketorolac Rash     Med Rec must be completed prior to using this [facility]***     Carie Caddy, CNM  01/28/2022 1:59 PM

## 2022-01-29 LAB — CBC
HCT: 32.4 % — ABNORMAL LOW (ref 36.0–46.0)
Hemoglobin: 11.1 g/dL — ABNORMAL LOW (ref 12.0–15.0)
MCH: 31.4 pg (ref 26.0–34.0)
MCHC: 34.3 g/dL (ref 30.0–36.0)
MCV: 91.5 fL (ref 80.0–100.0)
Platelets: 223 10*3/uL (ref 150–400)
RBC: 3.54 MIL/uL — ABNORMAL LOW (ref 3.87–5.11)
RDW: 12.4 % (ref 11.5–15.5)
WBC: 13.5 10*3/uL — ABNORMAL HIGH (ref 4.0–10.5)
nRBC: 0 % (ref 0.0–0.2)

## 2022-01-29 LAB — RPR: RPR Ser Ql: NONREACTIVE

## 2022-01-29 MED ORDER — VARICELLA VIRUS VACCINE LIVE 1350 PFU/0.5ML IJ SUSR
0.5000 mL | Freq: Once | INTRAMUSCULAR | Status: AC
  Administered 2022-01-29: 0.5 mL via SUBCUTANEOUS
  Filled 2022-01-29: qty 0.5

## 2022-01-29 NOTE — Final Progress Note (Signed)
Post Partum Day 0 Subjective: Bethany Williams is feeling well overall. She is ambulating, voiding, and tolerating POs without difficulty. Her pain is well-controlled and her bleeding is WNL. She reports a little bit of anxiety but feels mood is stable. Breastfeeding is going well. She would like to go home today at 24 hours PP.  Objective: Blood pressure 120/78, pulse 63, temperature 98.5 F (36.9 C), temperature source Oral, resp. rate 20, height 5' (1.524 m), weight 63 kg, last menstrual period 05/11/2021, SpO2 99 %, unknown if currently breastfeeding.  Physical Exam:  General: alert, cooperative, and appears stated age Heart: RRR Lungs: CTAB Lochia: appropriate Uterine Fundus: firm Laceration: healing well, bruising on right labia DVT Evaluation: No evidence of DVT seen on physical exam.  Recent Labs    01/28/22 1119 01/29/22 0600  HGB 12.4 11.1*  HCT 35.5* 32.4*    Assessment/Plan: Discharge home Discharge instructions reviewed Follow up: video visit in 2 weeks, office visit in 6 weeks Considering Nexplanon for contraception   LOS: 1 day   Glenetta Borg, CNM 01/29/2022, 10:32 AM

## 2022-01-29 NOTE — Progress Notes (Signed)
D/c home via car/verb understanding of d/c instru

## 2022-01-31 ENCOUNTER — Telehealth: Payer: Self-pay | Admitting: Licensed Clinical Social Worker

## 2022-01-31 NOTE — Patient Outreach (Signed)
Transition Care Management Unsuccessful Follow-up Telephone Call  Date of discharge and from where:  Unknown  Attempts:  1st Attempt  Reason for unsuccessful TCM follow-up call:  Left voice message  Dickie La, BSW, MSW, [employer] LCSW [facility]  [facility] [email or web address] Phone: [phone]

## 2022-02-04 ENCOUNTER — Encounter: Payer: Medicaid Other | Admitting: Medical

## 2022-02-10 ENCOUNTER — Ambulatory Visit: Payer: Medicaid Other

## 2022-02-15 ENCOUNTER — Inpatient Hospital Stay: Admit: 2022-02-15 | Payer: Self-pay

## 2022-02-16 ENCOUNTER — Ambulatory Visit (INDEPENDENT_AMBULATORY_CARE_PROVIDER_SITE_OTHER): Payer: Medicaid Other | Admitting: Licensed Practical Nurse

## 2022-02-16 ENCOUNTER — Encounter: Payer: Self-pay | Admitting: Licensed Practical Nurse

## 2022-02-16 DIAGNOSIS — Z1332 Encounter for screening for maternal depression: Secondary | ICD-10-CM

## 2022-02-16 NOTE — Progress Notes (Signed)
Virtual Visit via Telephone Note  I connected with Bethany Williams on 02/16/22 at 10:55 AM EST by telephone and verified that I am speaking with the correct person using two identifiers.  Location: Patient: Home in [place], [place]  Provider: [place], [place]    I discussed the limitations, risks, security and privacy concerns of performing an evaluation and management service by telephone and the availability of in person appointments. I also discussed with the patient that there may be a patient responsible charge related to this service. The patient expressed understanding and agreed to proceed.   History of Present Illness: SVB 12/1 with LMD, had rapid labor/birth, vaginal and periurethral laceration with repair  female 3300 grams    "one of my stitches started to come out", uncomfortable when I  sit "wrong" Bleeding, minimal  Appetite  increased Sleep as good as it can with NB, nap in morning gets enough, infant sleeps 5 hrs between feeds, a few nights of cluster feeding  No concerns with voiding or  stooling  Mood, occasionally cry, sensitive to  comments EPDS 10. Has apt with Behavioral health January 8, wonders if she needs an increase in her medication.  Had PPD with first child Partner supportive,  First child doing ok, some behavioral stuff  Next child in 3-4 years, desires nexplanon  Walking a little  Pumping: not getting enough by latching, 5oz per side each session-freezer is full  Pap at 6 wks  Observations/Objective: Speaking in clear coherent sentences   Assessment and Plan: Postpartum 2 weeks   Follow Up Instructions: Reviewed concern for PPD, has good support and fu with Behavioral health   Desires Nexplanon, will schedule PP exam with this CNM   RTC in 4 wks, will need pap    I discussed the assessment and treatment plan with the patient. The patient was provided an opportunity to ask questions and all were answered. The patient agreed with the plan and demonstrated  an understanding of the instructions.   The patient was advised to call back or seek an in-person evaluation if the symptoms worsen or if the condition fails to improve as anticipated.  I provided 20 minutes of non-face-to-face time during this encounter.   [name] [name], CNM

## 2022-02-17 ENCOUNTER — Ambulatory Visit: Payer: Medicaid Other | Admitting: Licensed Practical Nurse

## 2022-03-07 DIAGNOSIS — F4312 Post-traumatic stress disorder, chronic: Secondary | ICD-10-CM | POA: Diagnosis not present

## 2022-03-08 ENCOUNTER — Telehealth: Payer: Self-pay

## 2022-03-17 ENCOUNTER — Ambulatory Visit (INDEPENDENT_AMBULATORY_CARE_PROVIDER_SITE_OTHER): Payer: Medicaid Other | Admitting: Licensed Practical Nurse

## 2022-03-17 ENCOUNTER — Other Ambulatory Visit (HOSPITAL_COMMUNITY)
Admission: RE | Admit: 2022-03-17 | Discharge: 2022-03-17 | Disposition: A | Discharge status: Home / Self Care | Payer: Medicaid Other | Source: Ambulatory Visit | Attending: Licensed Practical Nurse | Admitting: Licensed Practical Nurse

## 2022-03-17 DIAGNOSIS — Z3040 Encounter for surveillance of contraceptives, unspecified: Secondary | ICD-10-CM

## 2022-03-17 DIAGNOSIS — Z124 Encounter for screening for malignant neoplasm of cervix: Secondary | ICD-10-CM

## 2022-03-17 DIAGNOSIS — Z3202 Encounter for pregnancy test, result negative: Secondary | ICD-10-CM | POA: Diagnosis not present

## 2022-03-17 DIAGNOSIS — Z1332 Encounter for screening for maternal depression: Secondary | ICD-10-CM | POA: Diagnosis not present

## 2022-03-17 DIAGNOSIS — Z13220 Encounter for screening for lipoid disorders: Secondary | ICD-10-CM

## 2022-03-17 LAB — POCT URINE PREGNANCY: Preg Test, Ur: NEGATIVE

## 2022-03-17 MED ORDER — ETONOGESTREL 68 MG ~~LOC~~ IMPL: 68.0000 mg | DRUG_IMPLANT | Freq: Once | SUBCUTANEOUS | Status: DC

## 2022-03-17 MED ORDER — ETONOGESTREL 68 MG ~~LOC~~ IMPL
68.0000 mg | DRUG_IMPLANT | Freq: Once | SUBCUTANEOUS | Status: AC
  Administered 2022-03-17: 68 mg via SUBCUTANEOUS

## 2022-03-17 NOTE — Progress Notes (Addendum)
Postpartum Visit  Chief Complaint:  Chief Complaint  Patient presents with   Postpartum Care    History of Present Illness: Patient is a 27 y.o. Bethany Williams presents for postpartum visit.  Date of delivery: 01/28/2022 Type of delivery: Vaginal delivery - Vacuum or forceps assisted  no Episiotomy No.  Laceration: first degree vaginal and periurethral   Pregnancy or labor problems:  yes GDM, anxiety/depression, PTSD, hx 29wkbirth  Any problems since the delivery:  no NO concerns with voiding or stooling Bleeding has stopped Is pumping, gets 4-5 oz per breast per session Had IC, it was good May desire another child in 4-5 years, desires Nexplanon today  Mood has been good, has increased medication dose  Currently not working   Newborn Details:  SINGLETON :  1. Baby's name: female. Birth weight: 3300grams Maternal Details:  Breast Feeding:  yes Post partum depression/anxiety noted:  manageable   Edinburgh Post-Partum Depression Score:  0  Date of last PAP: 2022  normal LSIL 2020   Past Medical History:  Diagnosis Date   Anxiety    Depression    Family history of ovarian cancer    12/22 cancer genetic testing letter sent   Gestational diabetes    PTSD (post-traumatic stress disorder)     Past Surgical History:  Procedure Laterality Date   WISDOM TOOTH EXTRACTION      Prior to Admission medications   Medication Sig Start Date End Date Taking? Authorizing Provider  Cholecalciferol (VITAMIN D3) 250 MCG (10000 UT) TABS Take 1 tablet by mouth daily.   Yes [provider]  FLUoxetine (PROZAC) 10 MG capsule Take 30 mg by mouth every morning. 09/27/21  Yes [provider]  lamoTRIgine (LAMICTAL) 100 MG tablet Take 100 mg by mouth daily. 06/04/21  Yes [provider]  Prenatal Vit-Fe Fumarate-FA (MULTIVITAMIN-PRENATAL) 27-0.8 MG TABS tablet Take 1 tablet by mouth daily at 12 noon.   Yes [provider]    Allergies  Allergen Reactions   Sulfa  Antibiotics Anaphylaxis   Dilaudid [Hydromorphone]    Latex    Ketorolac Rash     Social History   Socioeconomic History   Marital status: Married    Spouse name: Denyse Amass   Number of children: 1   Years of education: 13.5   Highest education level: Not on file  Occupational History   Not on file  Tobacco Use   Smoking status: Former    Types: E-cigarettes    Quit date: 01/28/2017    Years since quitting: 5.1   Smokeless tobacco: Never  Vaping Use   Vaping Use: Every day  Substance and Sexual Activity   Alcohol use: Not Currently   Drug use: Never   Sexual activity: Yes    Birth control/protection: None  Other Topics Concern   Not on file  Social History Narrative   Not on file   Social Determinants of Health   Financial Resource Strain: Low Risk  (06/10/2021)   Overall Financial Resource Strain (CARDIA)    Difficulty of Paying Living Expenses: Not hard at all  Food Insecurity: No Food Insecurity (06/10/2021)   Hunger Vital Sign    Worried About Running Out of Food in the Last Year: Never true    [place] in the Last Year: Never true  Transportation Needs: No Transportation Needs (06/10/2021)   PRAPARE - Hydrologist (Medical): No    Lack of Transportation (Non-Medical): No  Physical Activity: Sufficiently Active (  06/10/2021)   Exercise Vital Sign    Days of Exercise per Week: 5 days    Minutes of Exercise per Session: 30 min  Stress: No Stress Concern Present (06/10/2021)   [place]    Feeling of Stress : Not at all  Social Connections: Moderately Isolated (06/10/2021)   Social Connection and Isolation Panel [NHANES]    Frequency of Communication with Friends and Family: More than three times a week    Frequency of Social Gatherings with Friends and Family: More than three times a week    Attends Religious Services: Never    Marine scientist or Organizations:  No    Attends Archivist Meetings: Never    Marital Status: Living with partner  Intimate Partner Violence: Not At Risk (06/10/2021)   Humiliation, Afraid, Rape, and Kick questionnaire    Fear of Current or Ex-Partner: No    Emotionally Abused: No    Physically Abused: No    Sexually Abused: No    Family History  Problem Relation Age of Onset   Ovarian cancer Mother 2   Heart disease Mother    Stroke Mother    Hypertension Mother    Leukemia Father    Hypertension Sister    Diabetes Maternal Aunt    Diabetes Maternal Uncle    Hypertension Maternal Grandmother    Diabetes Maternal Grandfather    Hypertension Maternal Grandfather    Diabetes Paternal Grandmother    Lung cancer Paternal Grandfather     ROS see HPI  Physical Exam BP 113/63   Pulse 64   Wt 119 lb 8 oz (54.2 kg)   Breastfeeding Yes   BMI 23.34 kg/m   Physical Exam Constitutional:      Appearance: Normal appearance.  Genitourinary:     Vulva normal.     Genitourinary Comments: Cervix pink, no lesions Bimanual: uterus non gravid, non tender, no masses, adnexa non tender no masses, some tone   Cardiovascular:     Rate and Rhythm: Normal rate and regular rhythm.     Pulses: Normal pulses.     Heart sounds: Normal heart sounds.  Pulmonary:     Effort: Pulmonary effort is normal.     Breath sounds: Normal breath sounds.  Chest:     Comments: Breasts: lactating, no redness or masses, nipples intact bilaterally  Abdominal:     General: There is no distension.  Musculoskeletal:        General: Normal range of motion.     Cervical back: Normal range of motion and neck supple.  Neurological:     General: No focal deficit present.  Skin:    General: Skin is warm.  Psychiatric:        Mood and Affect: Mood normal.      Female Chaperone present during breast and/or pelvic exam.  Assessment: 27 y.o. [id number] presenting for 6 week postpartum visit  Plan: Problem List Items Addressed This  Visit   None Visit Diagnoses     Encounter for surveillance of contraceptive device    -  Primary   Relevant Orders   POCT urine pregnancy (Completed)   Lipid panel   Care and examination of lactating mother       Relevant Orders   Lipid panel   Lipid screening            1) Contraception Nexplanon placed by Dr Hulan Fray, see Note .  2)  Pap -  ASCCP guidelines and rational discussed.  Patient opts for 3year  screening interval  3) Patient underwent screening for postpartum depression with no concerns noted.  4) Follow up 1 year for routine annual exam  Ameli Sangiovanni, [facility]  03/26/22  4:00 PM

## 2022-03-18 ENCOUNTER — Encounter: Payer: Self-pay | Admitting: Licensed Practical Nurse

## 2022-03-18 LAB — LIPID PANEL
Chol/HDL Ratio: 2.7 ratio (ref 0.0–4.4)
Cholesterol, Total: 192 mg/dL (ref 100–199)
HDL: 72 mg/dL (ref >39)
LDL Chol Calc (NIH): 110 mg/dL — ABNORMAL HIGH (ref 0–99)
Triglycerides: 51 mg/dL (ref 0–149)
VLDL Cholesterol Cal: 10 mg/dL (ref 5–40)

## 2022-03-23 LAB — CYTOLOGY - PAP
Comment: NEGATIVE
Diagnosis: NEGATIVE
High risk HPV: NEGATIVE

## 2022-04-26 NOTE — Progress Notes (Signed)
UPT was negative. Consent was signed. Time out procedure was done. Her left arm was prepped with betadine and infiltrated with 3 cc of 1% lidocaine. After adequate anesthesia was assured, the Nexplanon device was placed according to standard of care. Her arm was hemostatic and was bandaged. She tolerated the procedure well.

## 2023-01-30 ENCOUNTER — Ambulatory Visit: Payer: Medicaid Other | Admitting: Physician Assistant
